# Patient Record
Sex: Male | Born: 1945 | Race: Black or African American | Hispanic: No | Marital: Married | Smoking: Former smoker
Health system: Southern US, Community
[De-identification: ages and names within clinical notes are randomized; demographics above are authoritative.]

---

## 2000-08-11 ENCOUNTER — Encounter: Admission: RE | Admit: 2000-08-11 | Discharge: 2000-08-11 | Payer: Self-pay | Admitting: Family Medicine

## 2000-08-11 ENCOUNTER — Encounter: Payer: Self-pay | Admitting: Family Medicine

## 2006-04-28 ENCOUNTER — Ambulatory Visit: Payer: Self-pay | Admitting: Internal Medicine

## 2006-05-25 ENCOUNTER — Ambulatory Visit: Payer: Self-pay | Admitting: Internal Medicine

## 2007-06-21 ENCOUNTER — Ambulatory Visit: Payer: Self-pay | Admitting: Hematology & Oncology

## 2007-06-21 ENCOUNTER — Inpatient Hospital Stay (HOSPITAL_COMMUNITY): Admission: AD | Admit: 2007-06-21 | Discharge: 2007-06-25 | Payer: Self-pay | Admitting: Gastroenterology

## 2007-06-23 ENCOUNTER — Encounter (INDEPENDENT_AMBULATORY_CARE_PROVIDER_SITE_OTHER): Payer: Self-pay | Admitting: Gastroenterology

## 2007-07-03 LAB — CBC & DIFF AND RETIC
BASO%: 2 % (ref 0.0–2.0)
EOS%: 6.4 % (ref 0.0–7.0)
LYMPH%: 32.4 % (ref 14.0–48.0)
MCH: 22.5 pg — ABNORMAL LOW (ref 28.0–33.4)
MCHC: 31.3 g/dL — ABNORMAL LOW (ref 32.0–35.9)
MCV: 72.1 fL — ABNORMAL LOW (ref 81.6–98.0)
MONO%: 11.8 % (ref 0.0–13.0)
Platelets: 357 10*3/uL (ref 145–400)
RBC: 4.98 10*6/uL (ref 4.20–5.71)
Retic %: 1.4 % (ref 0.7–2.3)
WBC: 3.5 10*3/uL — ABNORMAL LOW (ref 4.0–10.0)

## 2007-07-03 LAB — MORPHOLOGY

## 2007-07-04 ENCOUNTER — Encounter: Admission: RE | Admit: 2007-07-04 | Discharge: 2007-07-04 | Payer: Self-pay | Admitting: Gastroenterology

## 2007-07-05 LAB — TRANSFERRIN RECEPTOR, SOLUABLE: Transferrin Receptor, Soluble: 135.7 nmol/L

## 2007-08-03 ENCOUNTER — Ambulatory Visit (HOSPITAL_COMMUNITY): Admission: RE | Admit: 2007-08-03 | Discharge: 2007-08-03 | Payer: Self-pay | Admitting: Gastroenterology

## 2007-08-22 ENCOUNTER — Ambulatory Visit: Payer: Self-pay | Admitting: Hematology & Oncology

## 2007-08-24 LAB — CBC WITH DIFFERENTIAL (CANCER CENTER ONLY)
BASO%: 0.3 % (ref 0.0–2.0)
Eosinophils Absolute: 0.2 10*3/uL (ref 0.0–0.5)
LYMPH%: 32.2 % (ref 14.0–48.0)
MCH: 24.7 pg — ABNORMAL LOW (ref 28.0–33.4)
MCV: 77 fL — ABNORMAL LOW (ref 82–98)
MONO%: 11.8 % (ref 0.0–13.0)
NEUT#: 1.8 10*3/uL (ref 1.5–6.5)
Platelets: 292 10*3/uL (ref 145–400)
RBC: 5.16 10*6/uL (ref 4.20–5.70)
RDW: 16.6 % — ABNORMAL HIGH (ref 10.5–14.6)
WBC: 3.5 10*3/uL — ABNORMAL LOW (ref 4.0–10.0)

## 2007-08-24 LAB — FERRITIN: Ferritin: 61 ng/mL (ref 22–322)

## 2007-08-24 LAB — RETICULOCYTES (CHCC): Retic Ct Pct: 0.7 % (ref 0.4–3.1)

## 2007-11-08 ENCOUNTER — Ambulatory Visit: Payer: Self-pay | Admitting: Hematology & Oncology

## 2007-11-09 LAB — CHCC SATELLITE - SMEAR

## 2007-11-09 LAB — MANUAL DIFFERENTIAL (CHCC SATELLITE)
ANC (CHCC HP manual diff): 0.9 10*3/uL — ABNORMAL LOW (ref 1.5–6.5)
LYMPH: 44 % (ref 14–48)
PLT EST ~~LOC~~: ADEQUATE
SEG: 29 % — ABNORMAL LOW (ref 40–75)

## 2007-11-09 LAB — CBC WITH DIFFERENTIAL (CANCER CENTER ONLY)
MCHC: 32 g/dL (ref 32.0–35.9)
Platelets: 190 10*3/uL (ref 145–400)
RDW: 15.7 % — ABNORMAL HIGH (ref 10.5–14.6)
WBC: 3.2 10*3/uL — ABNORMAL LOW (ref 4.0–10.0)

## 2007-11-09 LAB — RETICULOCYTES (CHCC)
RBC.: 5.69 MIL/uL (ref 4.22–5.81)
Retic Ct Pct: 0.5 % (ref 0.4–3.1)

## 2008-03-08 ENCOUNTER — Ambulatory Visit: Payer: Self-pay | Admitting: Hematology & Oncology

## 2008-03-11 LAB — CBC WITH DIFFERENTIAL (CANCER CENTER ONLY)
BASO%: 0.3 % (ref 0.0–2.0)
EOS%: 13.1 % — ABNORMAL HIGH (ref 0.0–7.0)
HCT: 37.9 % — ABNORMAL LOW (ref 38.7–49.9)
LYMPH%: 44.8 % (ref 14.0–48.0)
MCH: 23.9 pg — ABNORMAL LOW (ref 28.0–33.4)
MCHC: 32.4 g/dL (ref 32.0–35.9)
MCV: 74 fL — ABNORMAL LOW (ref 82–98)
MONO#: 0.3 10*3/uL (ref 0.1–0.9)
MONO%: 12.6 % (ref 0.0–13.0)
NEUT%: 29.2 % — ABNORMAL LOW (ref 40.0–80.0)
Platelets: 203 10*3/uL (ref 145–400)
RDW: 16.2 % — ABNORMAL HIGH (ref 10.5–14.6)
WBC: 2.5 10*3/uL — ABNORMAL LOW (ref 4.0–10.0)

## 2008-03-11 LAB — CHCC SATELLITE - SMEAR

## 2008-03-13 LAB — FERRITIN: Ferritin: 25 ng/mL (ref 22–322)

## 2008-03-13 LAB — RETICULOCYTES (CHCC)
ABS Retic: 45 10*3/uL (ref 19.0–186.0)
RBC.: 5.62 MIL/uL (ref 4.22–5.81)

## 2008-07-05 ENCOUNTER — Ambulatory Visit: Payer: Self-pay | Admitting: Hematology & Oncology

## 2008-07-08 LAB — CHCC SATELLITE - SMEAR

## 2008-07-08 LAB — CBC WITH DIFFERENTIAL (CANCER CENTER ONLY)
Eosinophils Absolute: 0.3 10*3/uL (ref 0.0–0.5)
HCT: 41.9 % (ref 38.7–49.9)
LYMPH%: 41.1 % (ref 14.0–48.0)
MCH: 23.5 pg — ABNORMAL LOW (ref 28.0–33.4)
MCV: 74 fL — ABNORMAL LOW (ref 82–98)
MONO%: 13.6 % — ABNORMAL HIGH (ref 0.0–13.0)
NEUT%: 36.1 % — ABNORMAL LOW (ref 40.0–80.0)
Platelets: 200 10*3/uL (ref 145–400)
RBC: 5.64 10*6/uL (ref 4.20–5.70)

## 2008-07-08 LAB — RETICULOCYTES (CHCC)
ABS Retic: 44.9 10*3/uL (ref 19.0–186.0)
RBC.: 5.61 MIL/uL (ref 4.22–5.81)
Retic Ct Pct: 0.8 % (ref 0.4–3.1)

## 2008-07-08 LAB — TECHNOLOGIST REVIEW CHCC SATELLITE

## 2008-10-11 ENCOUNTER — Ambulatory Visit: Payer: Self-pay | Admitting: Hematology & Oncology

## 2008-10-14 LAB — CBC WITH DIFFERENTIAL (CANCER CENTER ONLY)
BASO#: 0 10*3/uL (ref 0.0–0.2)
Eosinophils Absolute: 0.2 10*3/uL (ref 0.0–0.5)
HGB: 13.1 g/dL (ref 13.0–17.1)
LYMPH%: 45.8 % (ref 14.0–48.0)
MCH: 23.8 pg — ABNORMAL LOW (ref 28.0–33.4)
MCV: 74 fL — ABNORMAL LOW (ref 82–98)
MONO#: 0.4 10*3/uL (ref 0.1–0.9)
MONO%: 12.3 % (ref 0.0–13.0)
RBC: 5.52 10*6/uL (ref 4.20–5.70)
WBC: 3.1 10*3/uL — ABNORMAL LOW (ref 4.0–10.0)

## 2008-10-14 LAB — FERRITIN: Ferritin: 11 ng/mL — ABNORMAL LOW (ref 22–322)

## 2008-11-11 ENCOUNTER — Ambulatory Visit: Payer: Self-pay | Admitting: Hematology & Oncology

## 2009-01-03 ENCOUNTER — Ambulatory Visit: Payer: Self-pay | Admitting: Hematology & Oncology

## 2009-01-06 LAB — CBC WITH DIFFERENTIAL (CANCER CENTER ONLY)
BASO#: 0 10*3/uL (ref 0.0–0.2)
Eosinophils Absolute: 0.2 10*3/uL (ref 0.0–0.5)
HGB: 13.3 g/dL (ref 13.0–17.1)
LYMPH#: 1.4 10*3/uL (ref 0.9–3.3)
MONO#: 0.3 10*3/uL (ref 0.1–0.9)
NEUT#: 1.3 10*3/uL — ABNORMAL LOW (ref 1.5–6.5)
RBC: 5.52 10*6/uL (ref 4.20–5.70)
WBC: 3.2 10*3/uL — ABNORMAL LOW (ref 4.0–10.0)

## 2009-01-06 LAB — CHCC SATELLITE - SMEAR

## 2009-01-06 LAB — RETICULOCYTES (CHCC): Retic Ct Pct: 0.5 % (ref 0.4–3.1)

## 2009-07-11 ENCOUNTER — Ambulatory Visit: Payer: Self-pay | Admitting: Hematology & Oncology

## 2009-08-21 ENCOUNTER — Ambulatory Visit: Payer: Self-pay | Admitting: Hematology & Oncology

## 2009-08-25 LAB — CBC WITH DIFFERENTIAL (CANCER CENTER ONLY)
BASO#: 0 10*3/uL (ref 0.0–0.2)
BASO%: 0.3 % (ref 0.0–2.0)
EOS%: 6.3 % (ref 0.0–7.0)
Eosinophils Absolute: 0.2 10*3/uL (ref 0.0–0.5)
HCT: 38.6 % — ABNORMAL LOW (ref 38.7–49.9)
HGB: 12.6 g/dL — ABNORMAL LOW (ref 13.0–17.1)
LYMPH#: 1.5 10*3/uL (ref 0.9–3.3)
LYMPH%: 44.7 % (ref 14.0–48.0)
MCH: 23.9 pg — ABNORMAL LOW (ref 28.0–33.4)
MCHC: 32.6 g/dL (ref 32.0–35.9)
MCV: 73 fL — ABNORMAL LOW (ref 82–98)
MONO#: 0.3 10*3/uL (ref 0.1–0.9)
MONO%: 8.4 % (ref 0.0–13.0)
NEUT#: 1.3 10*3/uL — ABNORMAL LOW (ref 1.5–6.5)
NEUT%: 40.3 % (ref 40.0–80.0)
Platelets: 199 10*3/uL (ref 145–400)
RBC: 5.28 10*6/uL (ref 4.20–5.70)
RDW: 16.3 % — ABNORMAL HIGH (ref 10.5–14.6)
WBC: 3.3 10*3/uL — ABNORMAL LOW (ref 4.0–10.0)

## 2009-08-25 LAB — TECHNOLOGIST REVIEW CHCC SATELLITE

## 2009-08-26 LAB — FERRITIN: Ferritin: 12 ng/mL — ABNORMAL LOW (ref 22–322)

## 2009-08-26 LAB — RETICULOCYTES (CHCC)
ABS Retic: 58.3 10*3/uL (ref 19.0–186.0)
RBC.: 5.3 MIL/uL (ref 4.22–5.81)
Retic Ct Pct: 1.1 % (ref 0.4–3.1)

## 2010-01-09 ENCOUNTER — Ambulatory Visit: Payer: Self-pay | Admitting: Hematology & Oncology

## 2010-05-25 IMAGING — CT CT ENTERO PELVIS W/CM
2 of 6 series · 16 of 46 positions shown, 18 images · IV contrast (VOLUMEN & [ID] OMNI 300)
Comparison: None

CT ABDOMEN:

CLINICAL DATA: Anemia.  patient without complaints.

CT ABDOMEN AND PELVIS WITH CONTRAST (CT ENTEROGRAPHY)
TECHNIQUE: Multidetector CT of the abdomen and pelvis during bolus
administration of intravenous contrast. Negative oral contrast
VoLumen was given.
Contrast:  100 ml [id number]

[Series 3: enterography · axial · 0.74mm/px · z∈[-399,+1]mm · 13 of 182 slices shown, 15 images]
[im 11/182  soft-tissue]
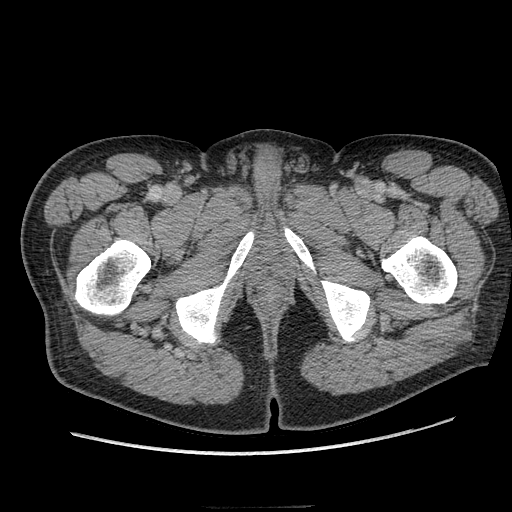
[im 11/182  bone]
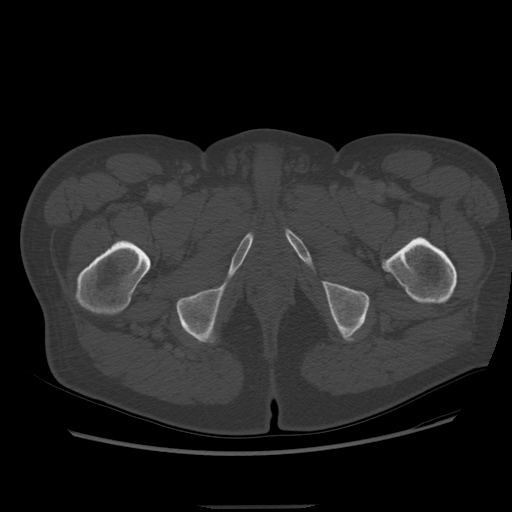
[im 22/182  soft-tissue]
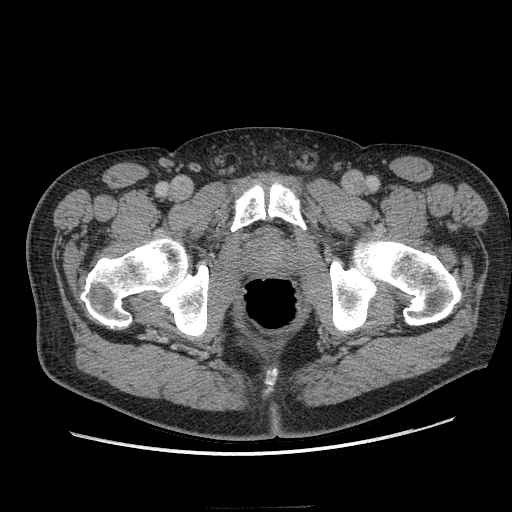
[im 43/182  soft-tissue]
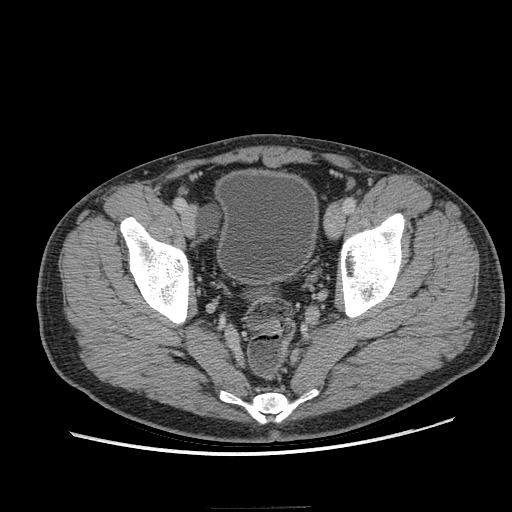
[im 54/182  soft-tissue]
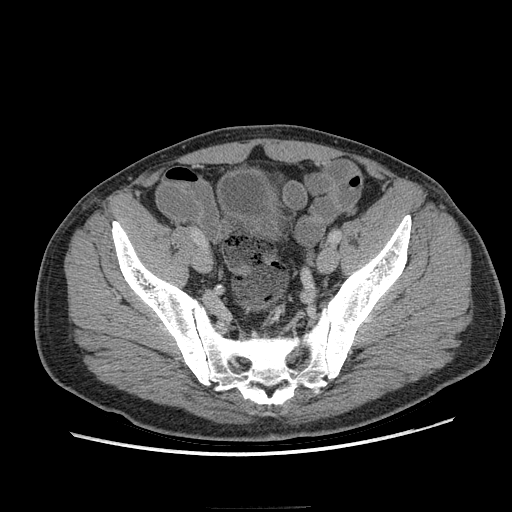
[im 64/182  soft-tissue]
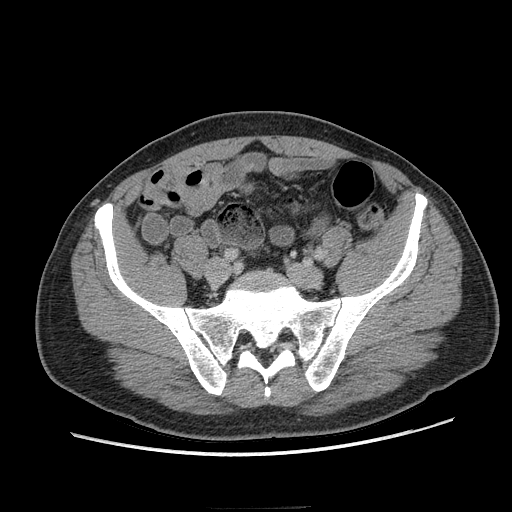
[im 75/182  soft-tissue]
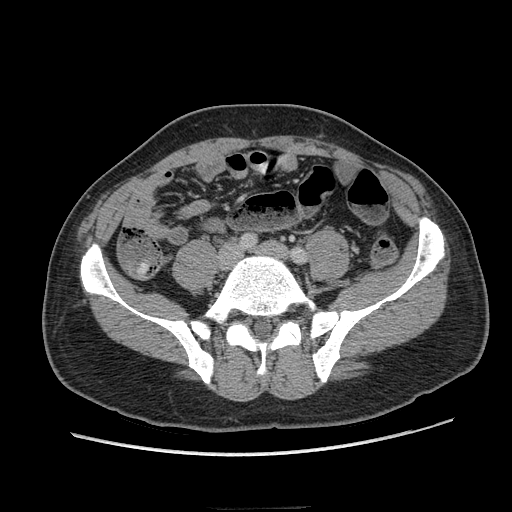
[im 96/182  soft-tissue]
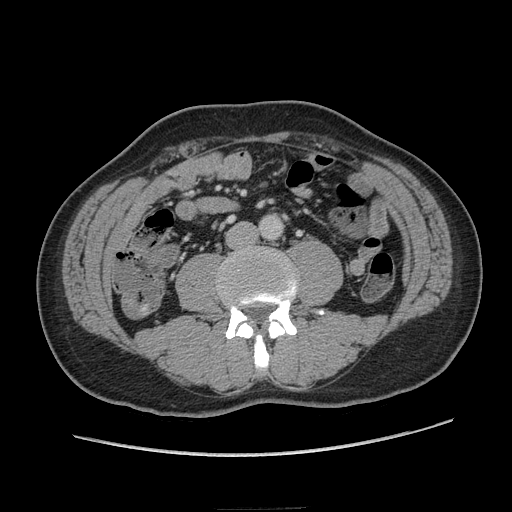
[im 107/182  soft-tissue]
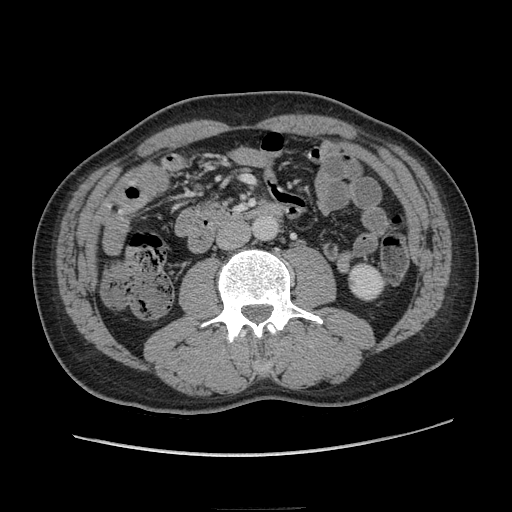
[im 118/182  soft-tissue]
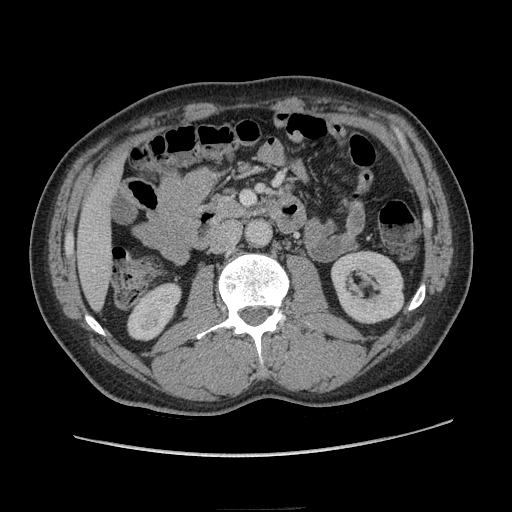
[im 118/182  bone]
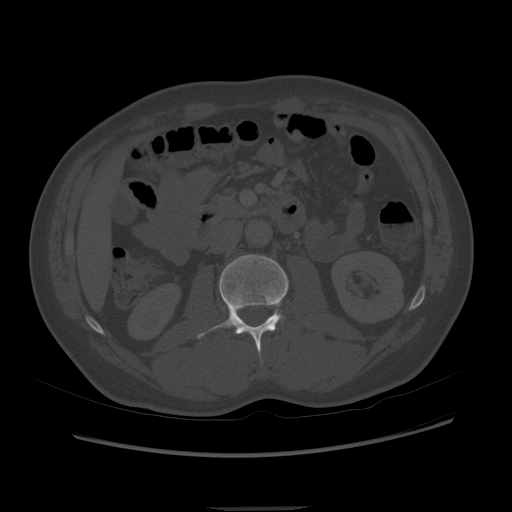
[im 128/182  soft-tissue]
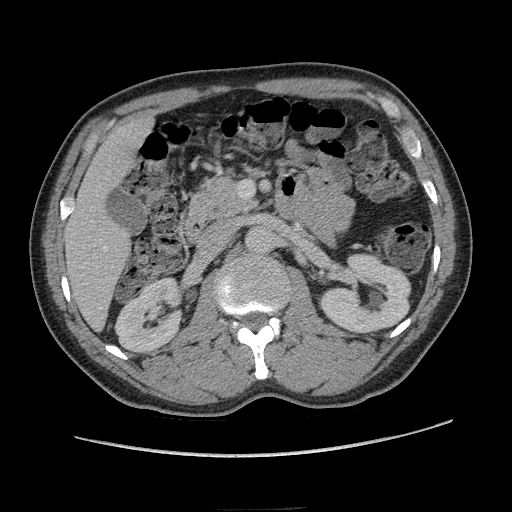
[im 139/182  soft-tissue]
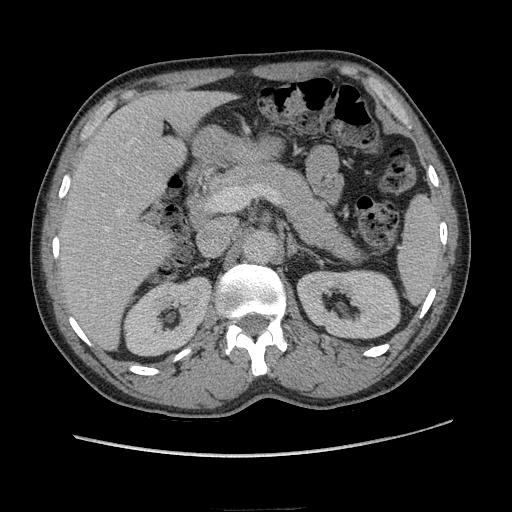
[im 160/182  soft-tissue]
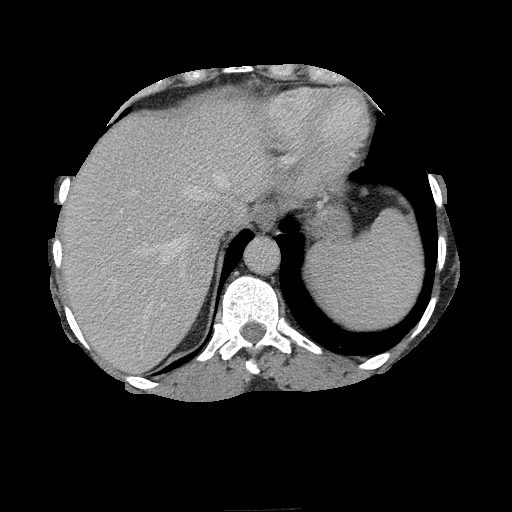
[im 171/182  soft-tissue]
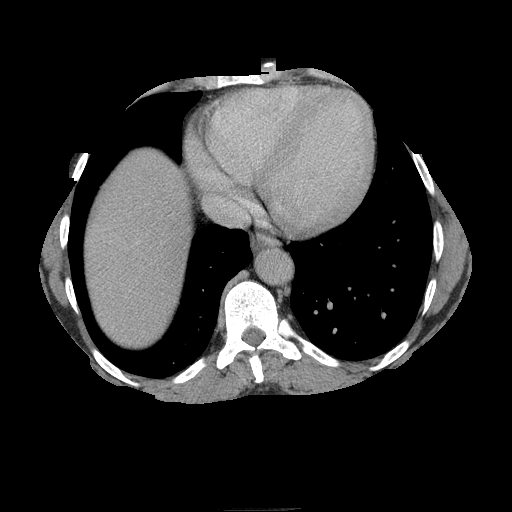

[Series 602: sagittal body · sagittal · 1.03mm/px · 3 of 149 slices shown]
[im 50/149  soft-tissue]
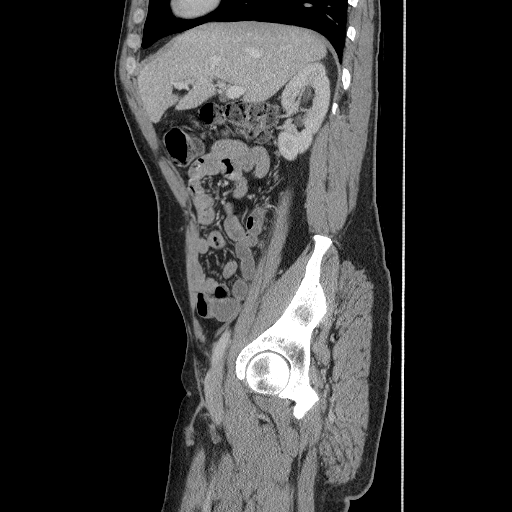
[im 66/149  soft-tissue]
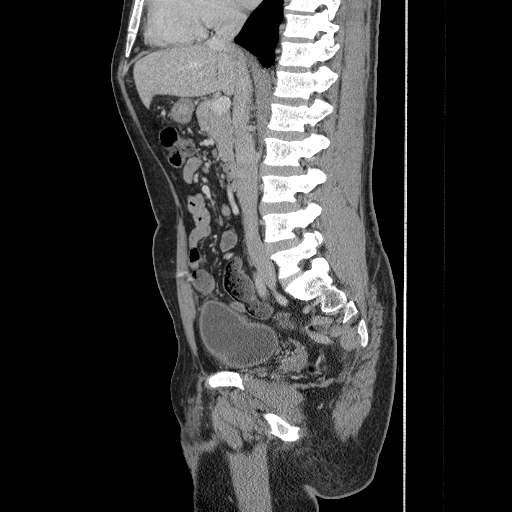
[im 83/149  soft-tissue]
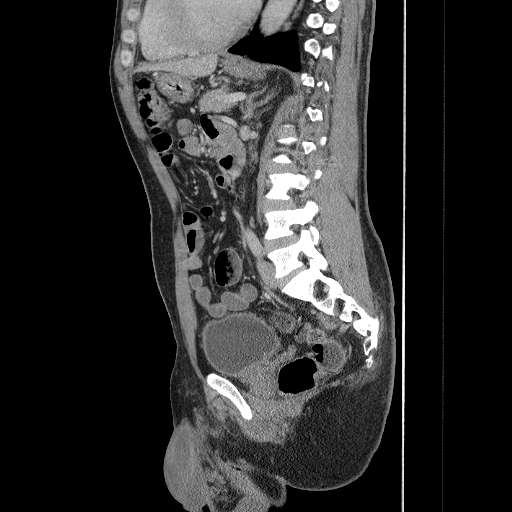

[16 of 46 positions shown; findings below may reference images not displayed]

FINDINGS: Clear lung bases.  Mild cardiomegaly without pericardial
or pleural effusion.  Normal liver, spleen.  The stomach is under
distended.  Normal pancreas, gallbladder, biliary tract .  A left
renal lesion measures 1.0 cm and fluid density on portal venous
phase images.  Other renal lesions are too small to characterize.
Punctate nonobstructive left renal calculus suspected on image 74
coronals.  No ascites.  No retroperitoneal or retrocrural
adenopathy. Normal colon.

The extent of small bowel distension with enteric contrast is poor
to moderate.  The terminal ileum is normal on image 88 of series 3.
Given this limitation, no abnormality within the small bowel
identified.  No wall thickening, small bowel mass, or mesenteric
abnormality.  Loops of small bowel in the right upper abdomen
appear thick-walled, felt to be due to underdistension.  Image 68
of series 3.The surrounding mesenteric fat is within normal limits.
IMPRESSION: 1.  Poor to moderate small bowel distension with enteric contrast.
This limits evaluation.  No convincing evidence of acute process to
explain anemia.  Apparent small bowel wall thickening in the right
abdomen is felt to be due to underdistension.  If no other cause
for anemia is discovered, further evaluation should be considered.
This could be performed with standard postcontrast CT.
2.  Otherwise no acute abdominal process.
3.  Punctate left renal calculus.

CT PELVIS:
FINDINGS: Normal pelvic bowel loops.  A well-circumscribed 2.8 cm
fluid density structure immediately medial to the right external
iliac vasculature on image 72 is felt to most likely represent a
iliopsoas bursal collection.  No pelvic adenopathy.  Normal urinary
bladder and prostate.  Tiny sclerotic lesions in the bony pelvis
are likely bone islands.
IMPRESSION: 1.  No acute pelvic process.
2.  Probable iliopsoas bursal fluid collection adjacent to the
right groin vasculature, as described.

## 2010-06-16 NOTE — H&P (Signed)
[name], [name] NO.:  [id number]   MEDICAL RECORD NO.:  [id number]          PATIENT TYPE:  INP   LOCATION:  5158                         FACILITY:  [facility]   PHYSICIAN:  Graylin Shiver, M.D.   DATE OF BIRTH:  1945-05-25   DATE OF ADMISSION:  06/21/2007  DATE OF DISCHARGE:                              HISTORY & PHYSICAL   CHIEF COMPLAINT:  Anemia.   HISTORY OF PRESENT ILLNESS:  This patient is a 65 year old male who was  referred over to the office by Dr. Clovis Riley, because of severe anemia.  The patient essentially has been feeling okay according to him, but he  has been getting progressively tired very easily over the past 2-3  month.  He has also been noticing swelling at his lower extremities.  The patient was seen by Dr. Clovis Riley and on Jun 19, 2007, he had some  blood work drawn.  His hemoglobin was 3.9 and hematocrit 13.  The  patient denies any visible signs of gastrointestinal bleeding.  He has  no complaints of abdominal pain.  Denies dysphagia, odynophagia, nausea,  vomiting, hematemesis, or melena.  He denies chest pain.  He does have  shortness of breath with exertion.  He has lost approximately 10-12  pounds in the past few months, although he states that his appetite has  been okay.   ALLERGIES:  None known.   MEDICATIONS:  1. Hydrochlorothiazide.  2. Simvastatin 80 mg nightly.  3. Tiazac.  4. Multivitamin.  5. Garlic.  6. Cod liver oil.   MEDICAL PROBLEMS:  Hypertension and hypercholesterolemia.   SURGERIES:  None.   SOCIAL HISTORY:  He does not smoke or drink alcohol.   FAMILY HISTORY:  Noncontributory.   REVIEW OF SYSTEMS:  Negative, except for above.   PHYSICAL EXAMINATION:  Weight 198, blood pressure 110/50, pulse 72, and  temperature is 97.2.  He is in no distress.  Nonicteric.  Mouth and throat clear.  NECK:  Supple.  HEART:  Regular rhythm.  No murmurs, gallops, or rubs.  LUNGS:  Clear.  ABDOMEN:  Bowel sounds normal.   Soft and nontender.  No  hepatosplenomegaly.  EXTREMITIES:  Show 1+ pitting edema in the pretibial area.   IMPRESSION:  1. Severe anemia.  2. Weight loss.   PLAN:  Even though, the patient feels okay, his anemia is very critical  in my opinion and I think that we need to get him a blood transfusion  ASAP.  I think his workup and treatment will be expedited by admission  to the hospital.  I have therefore admitted him to [facility].  We will type and cross him, transfuse blood, and try to get his  hemoglobin up to an acceptable level.  I think he is going to need an  EGD and colonoscopy to further evaluate the upper and lower GI tract to  look for a source of this significant anemia.   I do not feel comfortable proceeding with evaluation or treatment at  this time as an outpatient.  ______________________________  Graylin Shiver, M.D.     SFG/MEDQ  D:  06/21/2007  T:  06/22/2007  Job:  [id number]   cc:   Joni Fears D. Young, MD, FCCP, FACP  L. Lupe Carney, M.D.

## 2010-06-16 NOTE — Consult Note (Signed)
NAMEGIORGI, [name] NO.:  [id number]   MEDICAL RECORD NO.:  [id number]          PATIENT TYPE:  INP   LOCATION:  5158                         FACILITY:  [facility]   PHYSICIAN:  Rose Phi. Myna Hidalgo, M.D. DATE OF BIRTH:  [birth year]   DATE OF CONSULTATION:  06/23/2007  DATE OF DISCHARGE:                                 CONSULTATION   REFERRING PHYSICIAN:  Graylin Shiver, MD.   REASON FOR CONSULTATION:  Severe microcytic anemia - iron deficiency.   HISTORY OF PRESENT ILLNESS:  Mr. Rogerson is a very nice 65 year old black  gentleman who is followed by Dr. Clovis Riley.  He is a Naval architect.  He  apparently has been having more problems with fatigue and weakness.   He is on several medications.  He does have a history of high blood  pressure and hypertension.   He does state that he has had occasional black stool in the past.   He has been getting more and more tired.  He subsequently was found to  be quite anemic.  As such, he was admitted.   Lab work done back on Jun 19, 2007, showed a white cell count of 3.1,  hemoglobin 3.9, hematocrit 13, and platelet count 90,000.  His MCV was  57.  He had normal BUN and creatinine.  His white cell differential  showed 46 segs, 26 lymphs, 16 monos, and 8 eosinophils.   He has been taking daily aspirin.  He denies any type of nonsteroidal  use.  He does not smoke or drink.   He was admitted.  He had numerous labs on admission.  He had B12 level  of 426.  Folate was greater than 20.  Iron studies showed iron less than  10 and TIBC of 462.   He had normal metabolic panel.  He had a BUN of 7 and creatinine 0.96.  Total protein 6.7 and albumin 3.6.   He had an LDH which was unremarkable.  He did undergo upper and lower  endoscopy.  This was done by Dr. Charlott Rakes.  There was no obvious  source of bleeding.  Biopsies, I think were taken.   Because of the negative colonoscopy and upper endoscopy, it was felt  that he needed to be  seen by hematology.   His appetite has been okay.  He is not a vegetarian.   He has been chewing a whole lot of ice.  He has been doing this for  several months.  He also says that he has been drinking vinegar.   On admission, his white cell count was 3.5, hemoglobin was 4.2, and  platelet count was 113.   On the 20th, white cell count was 4.4 and platelet count was 213,000.   On Jun 23, 2007, white cell count was 4.8, hemoglobin 7.8, hematocrit  25, and platelet count 154.  MCV was 69.   He has had 96 units of blood from what he tells me.   PAST MEDICAL HISTORY:  1. Hypertension.  2. Hyperlipidemia.   ALLERGIES:  None.   CURRENT MEDICATIONS:  1. Zocor 40 mg daily.  2. Hydrochlorothiazide 25 mg daily.  3. Cardizem CD 360 mg p.o. daily.   SOCIAL HISTORY:  Negative for tobacco and alcohol use.  He is a Ecologist.   FAMILY HISTORY:  Noncontributory.   REVIEW OF SYSTEMS:  As stated in the history of present illness.  He has  a little bit of headache.  He denies any double vision or blurred  vision.  There is no swallowing difficulties.  He has no nausea or  vomiting.  There has been no diarrhea or constipation.  Again, he has  noted occasional black stool.  He has had no cough.  He does get short  of breath with exertion.  He has had some swelling in his legs.   PHYSICAL EXAMINATION:  This is a well-developed, well-nourished black  gentleman in no obvious distress.  VITAL SIGNS:  Temperature 97.9, pulse 61, respiratory rate 18, and blood  pressure 123/75.  HEAD AND NECK:  Shows pale conjunctivae.  His pupils are reacting  appropriately.  He has no cheilitis.  There are no oral lesions.  Tongue  is without glossitis.  NECK:  Supple.  No adenopathy.  Thyroid nonpalpable.  LUNGS:  Clear bilaterally.  CARDIAC:  Regular rate and rhythm with normal S1 and S2.  He has a 1/6  systolic ejection murmur.  AXILLARY:  Shows no bilateral axillary adenopathy.  ABDOMEN:  Soft and  good bowel sounds.  He has no palpable abdominal  mass.  No palpable hepatosplenomegaly.  EXTREMITIES:  Shows no clubbing, cyanosis, or edema.  NEUROLOGIC:  No focal neurological deficits.  SKIN:  No rashes, ecchymoses, or petechiae.   Peripheral smear is pending.   IMPRESSION:  Mr. Doyon is a 65 year old gentleman with severe microcytic  anemia.  This is all consistent with iron deficiency.  Again, I do not  see any problems with respect to his white cells and platelets.  It is  certainly not uncommon for African Americans to have a slightly  depressed white cell count.   Once I review his peripheral smear, this will give me little more  information.   I do feel that he does need intravenous iron.  Again, iron dextran is  not available.  With iron dextran, we can give all the iron back in one  dose.  We will have to give him intravenous Venofer.  This could be  given once a week.  I suspect that he is going to need at least 4 weeks  of intravenous Venofer.   I do think a capsule endoscopy certainly would be indicated.  We will  leave this to GI for evaluation.   I do not see a need for bone marrow test right now.  Again, his white  cells and platelets all look fine.  Again, I will certainly review his  blood smear.   I do not see any evidence of sideroblastic anemia.  He has no evidence  for sickle cell anemia or other hemoglobinopathy.  He certainly is not  hemolyzing.   I do not see any other evidence of any nutritional deficiency.  Sometimes copper deficiency can lead to low white cells with microcytic  anemia.   I had an excellent fellowship with Mr. Gift and his family.  We are all  strong Christians, and we certainly share our faith, which certainly  made me feel better.   We will plan for the intravenous iron for tomorrow.  We can do the  rest  as an outpatient if he is going to be discharged to home.   Thank you so much for allowing me to see Mr. Coster.  We will  follow up as  an outpatient.      Rose Phi. Myna Hidalgo, M.D.  Electronically Signed     PRE/MEDQ  D:  06/23/2007  T:  06/24/2007  Job:  [id number]   cc:   Graylin Shiver, M.D.  Elsworth Soho, M.D.

## 2010-06-16 NOTE — Op Note (Signed)
NAMEKELECHI, [name] NO.:  [id number]   MEDICAL RECORD NO.:  [id number]          PATIENT TYPE:  AMB   LOCATION:  ENDO                         FACILITY:  [facility]   PHYSICIAN:  Graylin Shiver, M.D.   DATE OF BIRTH:  1945/06/04   DATE OF PROCEDURE:  08/03/2007  DATE OF DISCHARGE:  08/03/2007                               OPERATIVE REPORT   PROCEDURE:  Capsule endoscopy.   INDICATIONS FOR PROCEDURE:  Unexplained iron deficiency anemia.  The  patient had an EGD, colonoscopy and CT enterography, all of which were  unrevealing for a source of iron deficiency anemia from the GI tract.   The patient swallowed the capsule in the usual fashion and images were  obtained.  Review of the capsule endoscopy report reveals a normal  examination.  No lesions seen.  No evidence of active bleeding.  He had  no problems reported with the procedure.   IMPRESSION:  Normal capsule endoscopy.  No lesions seen to explain iron-  deficiency anemia.           ______________________________  Graylin Shiver, M.D.     SFG/MEDQ  D:  08/16/2007  T:  08/16/2007  Job:  [id number]   cc:   L. Lupe Carney, M.D.  Fax: [phone]   Rose Phi. Myna Hidalgo, M.D.  Fax: [phone]

## 2010-06-16 NOTE — Discharge Summary (Signed)
NAMEJORDAN, PARDINI NO.:  [id number]   MEDICAL RECORD NO.:  [id number]          PATIENT TYPE:  INP   LOCATION:  5158                         FACILITY:  [facility]   PHYSICIAN:  Bernette Redbird, M.D.   DATE OF BIRTH:  1945-09-14   DATE OF ADMISSION:  06/21/2007  DATE OF DISCHARGE:  06/25/2007                               DISCHARGE SUMMARY   FINAL DIAGNOSES:  1. 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  2. History of mild weight loss.  3. History of hypertension.  4. History of hypercholesterolemia.   COMPLICATIONS:  None.   CONSULTATIONS:  Rose Phi. Myna Hidalgo, M.D., Hematology.   PROCEDURES:  1. Upper endoscopy with biopsy.  2. Colonoscopy.  3. Iron infusion.  4. Transfusion of 7 units of packed red cells.   LABORATORY DATA:  Admission hemoglobin was 4.2 with an MCV of 57 and RDW  of 33.  Discharge hemoglobin was 9.9.  Admission white count 4500,  differential not performed.  Discharge white count 6800.  Admission  platelets 113,000 and discharge platelets 350,000.  Chemistry panel on  admission was normal except for potassium of 3.4.  Liver chemistries  were normal.  Albumin was 4.0.  Prior to transfusion, serum iron was  less than 10 and ferritin was 4 (that was actually obtained following  transfusion).  B12 and folate were normal.   Urinalysis was clear, and urine culture was negative.   HOSPITAL COURSE:  Mr. Okuda was sent over from Dr. August Saucer Mitchell's  office, because of anemia and Dr. Evette Cristal felt that his evaluation and  management could most appropriately be done as an inpatient.  He was  therefore admitted to the floor bed, transfused a total of 7 units of  packed cells, and then underwent endoscopy and colonoscopy by Dr.  Bosie Clos.  There was some nodularity in the proximal duodenum, which was  biopsied.  The biopsy results are pending at this time.  The colonoscopy  was grossly normal to the terminal ileum with a fair prep.  No source  of  anemia was evident on either examination.   With those studies thus being negative, and with the patient having been  hemoccult negative as an outpatient prior to admission, the patient was  seen by Dr. Arlan Organ who initiated an iron infusion, which was well  tolerated (Venofer).   By this time, the patient was feeling much stronger and his hemoglobin  had been stable post transfusion for several days, so discharge home was  felt to be appropriate.   DISPOSITION:  The patient is discharged to home on an unrestricted diet.  It is anticipated he will not return to work until his further GI  evaluation is completed and perhaps further iron infusions are  administered.  Activity is as tolerated.   DISCHARGE MEDICATIONS:  The patient will resume his previous home  medications which include:  1. Hydrochlorothiazide 25 mg daily.  2. Simvastatin 80 mg daily.  3. Diltzac 1 q.p.m.  4. Cod liver oil.  5. Garlic.  6. Multiple vitamins.   Tests pending at  the time of this discharge:  Duodenal biopsy results.   Planned evaluation and management following discharge:  Small bowel  evaluation (? small bowel capsule endoscopy versus radiographic  evaluation of the small bowel) through Dr. Evette Cristal, and further iron  infusions through Dr. Myna Hidalgo.   The patient is to call Dr. Luan Moore office to arrange followup  appointment in the near future, at which time his repeat CBC might be  checked and arrangements made for small bowel evaluation.  The patient  will contact Dr. Gustavo Lah office, if they do not call him first, to  arrange further iron infusions.   CONDITION ON DISCHARGE:  Improved.           ______________________________  Bernette Redbird, M.D.     RB/MEDQ  D:  06/25/2007  T:  06/25/2007  Job:  [id number]   cc:   Rose Phi. Myna Hidalgo, M.D.  Elsworth Soho, M.D.  Graylin Shiver, M.D.

## 2010-06-16 NOTE — Op Note (Signed)
NAMEJOHNN, KRASOWSKI NO.:  [id number]   MEDICAL RECORD NO.:  [id number]           PATIENT TYPE:   LOCATION:                                 FACILITY:   PHYSICIAN:  Shirley Friar, MDDATE OF BIRTH:  02/21/1945   DATE OF PROCEDURE:  DATE OF DISCHARGE:                               OPERATIVE REPORT   INDICATIONS:  Anemia.   MEDICATIONS:  Fentanyl 100 mcg IV and Versed 7 mg IV.   FINDINGS:  Rectal exam was normal.  A pediatric Pentax colonoscope was  inserted through the fair prepped colon and advanced to the cecum where  ileocecal valve and appendiceal orifice were identified.  In order to  reach the cecum, the patient had to be turned in the supine position and  abdominal pressure had to be applied.  Repeated loop reduction was also  necessary.  The terminal ileum was then intubated and was normal in  appearance.  On careful withdrawal of the colonoscope, no mucosal  abnormalities were seen.  Retroflexion was unremarkable.   ASSESSMENT:  Normal colonoscopy.   PLAN:  1. Repeat colonoscopy in 5 years.  2. Proceed with EGD.      Shirley Friar, MD  Electronically Signed     VCS/MEDQ  D:  06/23/2007  T:  06/24/2007  Job:  [id number]   cc:   Graylin Shiver, M.D.  Elsworth Soho, M.D.

## 2010-06-16 NOTE — Op Note (Signed)
[name], [name] NO.:  [id number]   MEDICAL RECORD NO.:  [id number]          PATIENT TYPE:  INP   LOCATION:  5158                         FACILITY:  [facility]   PHYSICIAN:  Shirley Friar, MDDATE OF BIRTH:  1945-12-17   DATE OF PROCEDURE:  DATE OF DISCHARGE:                               OPERATIVE REPORT   INDICATION:  Anemia.   MEDICATIONS:  Cetacaine spray x2, Versed 2 mg IV, additional medicine  given for preceding colonoscopy.   FINDINGS:  Endoscope was inserted through the oropharynx and esophagus  was intubated, which was normal in its entirety.  Endoscope was advanced  down to the stomach, which revealed normal stomach mucosa.  Retroflexion  was done, which revealed normal proximal stomach.  Endoscope was  straightened and advanced to the duodenal bulb where there was a  localized area of nodules with normal overlying mucosa.  Biopsies were  taken of these nodules.  The endoscope was advanced down to second  portion of duodenum, which was normal.   ASSESSMENT:  1. Duodenal bulb nodules, status post biopsy.  2. No source of anemia identified.   PLAN:  1. Follow up on path.  2. 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      Shirley Friar, MD  Electronically Signed     VCS/MEDQ  D:  06/23/2007  T:  06/23/2007  Job:  [id number]   cc:   Graylin Shiver, M.D.  Elsworth Soho, M.D.

## 2010-06-19 NOTE — Assessment & Plan Note (Signed)
[facility]                             PULMONARY OFFICE NOTE   LLEWELLYN, CHOPLIN                           MRN:          [id number]  DATE:04/28/2006                            DOB:          21-Aug-1945    PROBLEM:  A 65 year old gentleman followed by Dr. Lupe Carney for  Primary Care and self referred now to get his breathing checked.  Wife  is a patient here.   HISTORY:  He is a Naval architect, working mostly at night when he says  cold air through the truck window makes him short of breath, makes his  arms and legs feel weak, and his chest tight.  He has thought he was  more easily short of breath with exertion over the past year without any  sudden or specific event.  He has been never diagnosed with asthma, but  over the past 5 years has been more aware of seasonal rhinitis symptoms  in the spring and fall, and in the wintertime he seemed to have more  frequent colds with persistent phlegm.  He had a DOT physical in  [place] within the past couple of months with no specific findings  noted.   MEDICATIONS:  1. Diltiazem ER 380 mg.  2. Zocor 1/2 times 80mg .  3. HCTZ 25 mg.   NO DRUG ALLERGY LISTED.   REVIEW OF SYSTEMS:  Exertional dyspnea which is stable from one day to  the next and related mostly to brisk walks uphill.  Productive cough,  just sometimes hurts in very cold air only.  Nasal congestion.  His  weight has been stable.  He denies headache, fever, sweat, bloody or  purulent discharge.  He has coughed out tiny speck of blood once or  twice with very hard wintertime coughing.  He is concerned about areas  of hyperpigmented and thickened skin on his arms, trunk and legs which  seem to come and go over a period of several weeks.  Unaware of  adenopathy.  He denies exertional chest pain, palpitations, or syncope.   PAST HISTORY:  1. Hypertension.  2. Elevated cholesterol.  3. Allergic rhinitis.  4. No exposure to tuberculosis.  5. He has been told in the past he had a murmur.  6. No history of asthma or pneumonia.  7. He thinks he may have had pneumococcal vaccine remotely.  Did get      flu vaccine this fall.  8. No history of liver disease, cancer, or diabetes.   SOCIAL HISTORY:  He quit smoking 30 years ago, married with children.  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  He thinks he remembers working with benzene and  formaldehyde at that time.  No asbestos exposure.   FAMILY HISTORY:  Mother with allergies.   OBJECTIVE:  Weight 202 pounds, blood pressure 126/68, pulse regular 71,  room air saturation 100%.  He is a pleasant, relaxed gentleman  who seems  comfortable.  SKIN:  Several areas of 2-cm to 4-cm diameter, rather irregularly oval  hyperpigmentation and thickening on the trunk and legs, perhaps a total  of 10 or so in no particular pattern.  ADENOPATHY:  None found.  HEENT:  Dentures, nose and throat are clear.  No stridor or neck vein  distention, no thyromegaly.  CHEST:  I do not hear wheeze, cough, rales, or rhonchi.  Work of  breathing is not increased.  HEART:  Sounds are regular without murmur or gallop.  ABDOMEN:  No enlargement of liver or spleen.  EXTREMITIES:  No edema, cyanosis, or clubbing.   IMPRESSION:  1. Seasonal allergic rhinitis.  2. Dyspnea in cold air, suggest the possibility of asthma.  3. Skin lesion/rash, somewhat exematoid but not very specific.      Sarcoid might do this.   PLAN:  1. Blood for angiotensin converting enzyme, a CBC, and chemistry      panel.  2. Chest x-ray.  3. Pulmonary function test.  4. Consider possibility of an exercise stress test.  5. Skin biopsy.  6. Schedule return in 3 weeks, earlier p.r.n.     Clinton D. Maple Hudson, MD, Tonny Bollman, FACP  Electronically Signed    CDY/MedQ  DD: 04/29/2006  DT: 04/30/2006  Job #: [id number]    cc:   L. Lupe Carney, M.D.

## 2010-10-28 LAB — CROSSMATCH: Antibody Screen: NEGATIVE

## 2010-10-28 LAB — CBC
HCT: 13.9 — ABNORMAL LOW
HCT: 24.6 — ABNORMAL LOW
HCT: 24.8 — ABNORMAL LOW
HCT: 29.9 — ABNORMAL LOW
HCT: 33.8 — ABNORMAL LOW
Hemoglobin: 10.1 — ABNORMAL LOW
Hemoglobin: 11 — ABNORMAL LOW
Hemoglobin: 7.8 — CL
Hemoglobin: 8.4 — ABNORMAL LOW
Hemoglobin: 9.9 — ABNORMAL LOW
MCHC: 31.7
MCHC: 31.7
MCHC: 32.2
MCHC: 32.8
MCHC: 33.8
MCV: 69.6 — ABNORMAL LOW
Platelets: 113 — ABNORMAL LOW
Platelets: 182
Platelets: 213
Platelets: 280
RBC: 3.83 — ABNORMAL LOW
RDW: 33.5 — ABNORMAL HIGH
RDW: 34.6 — ABNORMAL HIGH
RDW: 34.9 — ABNORMAL HIGH
RDW: 36.7 — ABNORMAL HIGH
RDW: 36.8 — ABNORMAL HIGH
RDW: 37.3 — ABNORMAL HIGH
WBC: 3.5 — ABNORMAL LOW
WBC: 4.4
WBC: 4.8
WBC: 6.4

## 2010-10-28 LAB — BASIC METABOLIC PANEL
CO2: 28
Calcium: 9.1
Glucose, Bld: 96
Potassium: 3.8
Sodium: 141

## 2010-10-28 LAB — PROTEIN ELECTROPH W RFLX QUANT IMMUNOGLOBULINS
Albumin ELP: 57.1
Total Protein ELP: 6.5

## 2010-10-28 LAB — COMPREHENSIVE METABOLIC PANEL
ALT: 17
AST: 30
Albumin: 3.6
Albumin: 4
Alkaline Phosphatase: 59
BUN: 7
BUN: 9
Calcium: 9.3
Chloride: 101
Chloride: 105
Creatinine, Ser: 0.96
GFR calc Af Amer: >60
Potassium: 3.4 — ABNORMAL LOW
Sodium: 138
Total Bilirubin: 0.6
Total Bilirubin: 0.8
Total Protein: 7

## 2010-10-28 LAB — URINE CULTURE
Colony Count: NO GROWTH
Culture: NO GROWTH
Special Requests: NEGATIVE

## 2010-10-28 LAB — URINALYSIS, ROUTINE W REFLEX MICROSCOPIC
Bilirubin Urine: NEGATIVE
Glucose, UA: NEGATIVE
Hgb urine dipstick: NEGATIVE
Ketones, ur: NEGATIVE
Protein, ur: NEGATIVE

## 2010-10-28 LAB — FOLATE: Folate: >20

## 2010-10-28 LAB — VITAMIN B12: Vitamin B-12: 426 (ref 211–911)

## 2010-10-28 LAB — IRON AND TIBC

## 2010-10-28 LAB — ABO/RH: ABO/RH(D): O POS

## 2010-10-28 LAB — RETICULOCYTES: Retic Count, Absolute: 38.5

## 2010-10-28 LAB — SAVE SMEAR

## 2010-10-28 LAB — TISSUE TRANSGLUTAMINASE, IGA: Tissue Transglutaminase Ab, IgA: 0.2 U/mL (ref <7)

## 2015-04-16 DIAGNOSIS — L309 Dermatitis, unspecified: Secondary | ICD-10-CM | POA: Diagnosis not present

## 2015-06-16 DIAGNOSIS — E7800 Pure hypercholesterolemia, unspecified: Secondary | ICD-10-CM | POA: Diagnosis not present

## 2015-06-16 DIAGNOSIS — I10 Essential (primary) hypertension: Secondary | ICD-10-CM | POA: Diagnosis not present

## 2015-06-16 DIAGNOSIS — Z125 Encounter for screening for malignant neoplasm of prostate: Secondary | ICD-10-CM | POA: Diagnosis not present

## 2015-06-16 DIAGNOSIS — J309 Allergic rhinitis, unspecified: Secondary | ICD-10-CM | POA: Diagnosis not present

## 2015-11-24 DIAGNOSIS — H524 Presbyopia: Secondary | ICD-10-CM | POA: Diagnosis not present

## 2015-11-24 DIAGNOSIS — H2513 Age-related nuclear cataract, bilateral: Secondary | ICD-10-CM | POA: Diagnosis not present

## 2015-12-31 DIAGNOSIS — L290 Pruritus ani: Secondary | ICD-10-CM | POA: Diagnosis not present

## 2015-12-31 DIAGNOSIS — L309 Dermatitis, unspecified: Secondary | ICD-10-CM | POA: Diagnosis not present

## 2015-12-31 DIAGNOSIS — J309 Allergic rhinitis, unspecified: Secondary | ICD-10-CM | POA: Diagnosis not present

## 2015-12-31 DIAGNOSIS — E7800 Pure hypercholesterolemia, unspecified: Secondary | ICD-10-CM | POA: Diagnosis not present

## 2015-12-31 DIAGNOSIS — I10 Essential (primary) hypertension: Secondary | ICD-10-CM | POA: Diagnosis not present

## 2016-07-12 DIAGNOSIS — Z125 Encounter for screening for malignant neoplasm of prostate: Secondary | ICD-10-CM | POA: Diagnosis not present

## 2016-07-12 DIAGNOSIS — I10 Essential (primary) hypertension: Secondary | ICD-10-CM | POA: Diagnosis not present

## 2016-07-12 DIAGNOSIS — Z1159 Encounter for screening for other viral diseases: Secondary | ICD-10-CM | POA: Diagnosis not present

## 2016-07-12 DIAGNOSIS — L290 Pruritus ani: Secondary | ICD-10-CM | POA: Diagnosis not present

## 2016-07-12 DIAGNOSIS — E7800 Pure hypercholesterolemia, unspecified: Secondary | ICD-10-CM | POA: Diagnosis not present

## 2016-09-20 DIAGNOSIS — M549 Dorsalgia, unspecified: Secondary | ICD-10-CM | POA: Diagnosis not present

## 2016-10-04 DIAGNOSIS — I10 Essential (primary) hypertension: Secondary | ICD-10-CM | POA: Diagnosis not present

## 2016-10-04 DIAGNOSIS — R55 Syncope and collapse: Secondary | ICD-10-CM | POA: Diagnosis not present

## 2016-10-04 DIAGNOSIS — E785 Hyperlipidemia, unspecified: Secondary | ICD-10-CM | POA: Diagnosis not present

## 2016-10-04 DIAGNOSIS — D509 Iron deficiency anemia, unspecified: Secondary | ICD-10-CM | POA: Diagnosis not present

## 2016-10-04 DIAGNOSIS — R0789 Other chest pain: Secondary | ICD-10-CM | POA: Diagnosis not present

## 2016-10-04 DIAGNOSIS — D649 Anemia, unspecified: Secondary | ICD-10-CM | POA: Diagnosis not present

## 2016-10-04 DIAGNOSIS — R946 Abnormal results of thyroid function studies: Secondary | ICD-10-CM | POA: Diagnosis not present

## 2016-10-04 DIAGNOSIS — R42 Dizziness and giddiness: Secondary | ICD-10-CM | POA: Diagnosis not present

## 2016-10-05 DIAGNOSIS — R55 Syncope and collapse: Secondary | ICD-10-CM | POA: Diagnosis not present

## 2016-10-05 DIAGNOSIS — I351 Nonrheumatic aortic (valve) insufficiency: Secondary | ICD-10-CM | POA: Diagnosis not present

## 2016-10-05 DIAGNOSIS — I088 Other rheumatic multiple valve diseases: Secondary | ICD-10-CM | POA: Diagnosis not present

## 2016-10-05 DIAGNOSIS — I517 Cardiomegaly: Secondary | ICD-10-CM | POA: Diagnosis not present

## 2016-10-05 DIAGNOSIS — R946 Abnormal results of thyroid function studies: Secondary | ICD-10-CM | POA: Diagnosis not present

## 2016-10-05 DIAGNOSIS — D649 Anemia, unspecified: Secondary | ICD-10-CM | POA: Diagnosis not present

## 2016-10-05 DIAGNOSIS — I10 Essential (primary) hypertension: Secondary | ICD-10-CM | POA: Diagnosis not present

## 2016-10-05 DIAGNOSIS — I77810 Thoracic aortic ectasia: Secondary | ICD-10-CM | POA: Diagnosis not present

## 2016-10-05 DIAGNOSIS — R0789 Other chest pain: Secondary | ICD-10-CM | POA: Diagnosis not present

## 2016-10-06 DIAGNOSIS — R946 Abnormal results of thyroid function studies: Secondary | ICD-10-CM | POA: Diagnosis not present

## 2016-10-06 DIAGNOSIS — I10 Essential (primary) hypertension: Secondary | ICD-10-CM | POA: Diagnosis not present

## 2016-10-06 DIAGNOSIS — R55 Syncope and collapse: Secondary | ICD-10-CM | POA: Diagnosis not present

## 2016-10-06 DIAGNOSIS — D649 Anemia, unspecified: Secondary | ICD-10-CM | POA: Diagnosis not present

## 2016-10-06 DIAGNOSIS — R0789 Other chest pain: Secondary | ICD-10-CM | POA: Diagnosis not present

## 2016-10-08 ENCOUNTER — Other Ambulatory Visit: Payer: Self-pay | Admitting: *Deleted

## 2016-10-08 NOTE — Patient Outreach (Signed)
[facility]) Care Management  10/08/2016  [address]elene [address] 12/31/1945 [id number]  Insurance referral-HTA:  2 day hospital admission in [facility]:  Telephone call to patient; person answered call & advised that patient was not available. Contact information left with request to have patient return call.  Plan: Will follow up.  Colleen CanLinda Tennyson Wacha, RN BSN CCM Care Management Coordinator [facility] Care [facility]  [phone]

## 2016-10-11 ENCOUNTER — Encounter: Payer: Self-pay | Admitting: *Deleted

## 2016-10-11 ENCOUNTER — Other Ambulatory Visit: Payer: Self-pay | Admitting: *Deleted

## 2016-10-11 NOTE — Patient Outreach (Signed)
[facility]) Care Management  10/11/2016  [name] 10/23/1945 [id number]  Telephone call #2 to patient who was advised of reason for call & [facility].  HIPPA verification received from patient.   Patient voices that he was recently in hospital in [place] for [place] days. States home now & has support from spouse as needed. Voices that he will have follow up appointment with primary care provider 10/13/2016. States he has transportation & is able to drive. States he has no activity restrictions. Voices that he has been able to get prescriptions filled without problems & is taking his medications as instructed by his MD.  States he knows when to all MD & 911 if needed.   Patient voices that he does not need [facility] care management services at this time. He does consent to receive [facility] contact information to use as needed.   Plan: Send [facility] contact information to patient. Send to care management assistant to close out.  Keith Keith Keith Demarest, RN BSN CCM Care Management Coordinator [facility] Care [facility]  [phone]

## 2016-10-13 DIAGNOSIS — D509 Iron deficiency anemia, unspecified: Secondary | ICD-10-CM | POA: Diagnosis not present

## 2016-10-13 DIAGNOSIS — E039 Hypothyroidism, unspecified: Secondary | ICD-10-CM | POA: Diagnosis not present

## 2016-10-13 DIAGNOSIS — I10 Essential (primary) hypertension: Secondary | ICD-10-CM | POA: Diagnosis not present

## 2016-10-13 DIAGNOSIS — Z23 Encounter for immunization: Secondary | ICD-10-CM | POA: Diagnosis not present

## 2016-10-25 DIAGNOSIS — D509 Iron deficiency anemia, unspecified: Secondary | ICD-10-CM | POA: Diagnosis not present

## 2016-11-02 DIAGNOSIS — K640 First degree hemorrhoids: Secondary | ICD-10-CM | POA: Diagnosis not present

## 2016-11-02 DIAGNOSIS — K635 Polyp of colon: Secondary | ICD-10-CM | POA: Diagnosis not present

## 2016-11-02 DIAGNOSIS — D509 Iron deficiency anemia, unspecified: Secondary | ICD-10-CM | POA: Diagnosis not present

## 2016-11-04 DIAGNOSIS — D509 Iron deficiency anemia, unspecified: Secondary | ICD-10-CM | POA: Diagnosis not present

## 2016-11-04 DIAGNOSIS — K635 Polyp of colon: Secondary | ICD-10-CM | POA: Diagnosis not present

## 2016-11-29 DIAGNOSIS — H524 Presbyopia: Secondary | ICD-10-CM | POA: Diagnosis not present

## 2016-11-29 DIAGNOSIS — H2513 Age-related nuclear cataract, bilateral: Secondary | ICD-10-CM | POA: Diagnosis not present

## 2016-12-21 DIAGNOSIS — E039 Hypothyroidism, unspecified: Secondary | ICD-10-CM | POA: Diagnosis not present

## 2017-04-11 DIAGNOSIS — I10 Essential (primary) hypertension: Secondary | ICD-10-CM | POA: Diagnosis not present

## 2017-04-11 DIAGNOSIS — E039 Hypothyroidism, unspecified: Secondary | ICD-10-CM | POA: Diagnosis not present

## 2017-04-11 DIAGNOSIS — E7800 Pure hypercholesterolemia, unspecified: Secondary | ICD-10-CM | POA: Diagnosis not present

## 2017-04-11 DIAGNOSIS — J309 Allergic rhinitis, unspecified: Secondary | ICD-10-CM | POA: Diagnosis not present

## 2017-04-11 DIAGNOSIS — L309 Dermatitis, unspecified: Secondary | ICD-10-CM | POA: Diagnosis not present

## 2017-05-09 DIAGNOSIS — I10 Essential (primary) hypertension: Secondary | ICD-10-CM | POA: Diagnosis not present

## 2017-08-15 DIAGNOSIS — E039 Hypothyroidism, unspecified: Secondary | ICD-10-CM | POA: Diagnosis not present

## 2017-10-17 DIAGNOSIS — I10 Essential (primary) hypertension: Secondary | ICD-10-CM | POA: Diagnosis not present

## 2017-10-17 DIAGNOSIS — E7800 Pure hypercholesterolemia, unspecified: Secondary | ICD-10-CM | POA: Diagnosis not present

## 2017-10-17 DIAGNOSIS — E039 Hypothyroidism, unspecified: Secondary | ICD-10-CM | POA: Diagnosis not present

## 2017-10-17 DIAGNOSIS — J309 Allergic rhinitis, unspecified: Secondary | ICD-10-CM | POA: Diagnosis not present

## 2017-10-17 DIAGNOSIS — L309 Dermatitis, unspecified: Secondary | ICD-10-CM | POA: Diagnosis not present

## 2018-03-06 DIAGNOSIS — H2513 Age-related nuclear cataract, bilateral: Secondary | ICD-10-CM | POA: Diagnosis not present

## 2018-03-06 DIAGNOSIS — H524 Presbyopia: Secondary | ICD-10-CM | POA: Diagnosis not present

## 2018-04-20 DIAGNOSIS — J309 Allergic rhinitis, unspecified: Secondary | ICD-10-CM | POA: Diagnosis not present

## 2018-04-20 DIAGNOSIS — E039 Hypothyroidism, unspecified: Secondary | ICD-10-CM | POA: Diagnosis not present

## 2018-04-20 DIAGNOSIS — L309 Dermatitis, unspecified: Secondary | ICD-10-CM | POA: Diagnosis not present

## 2018-04-20 DIAGNOSIS — E7800 Pure hypercholesterolemia, unspecified: Secondary | ICD-10-CM | POA: Diagnosis not present

## 2018-04-20 DIAGNOSIS — I10 Essential (primary) hypertension: Secondary | ICD-10-CM | POA: Diagnosis not present

## 2018-06-14 DIAGNOSIS — M25552 Pain in left hip: Secondary | ICD-10-CM | POA: Diagnosis not present

## 2018-06-27 DIAGNOSIS — E039 Hypothyroidism, unspecified: Secondary | ICD-10-CM | POA: Diagnosis not present

## 2018-07-06 DIAGNOSIS — M545 Low back pain: Secondary | ICD-10-CM | POA: Diagnosis not present

## 2018-07-06 DIAGNOSIS — E039 Hypothyroidism, unspecified: Secondary | ICD-10-CM | POA: Diagnosis not present

## 2018-08-10 DIAGNOSIS — M5441 Lumbago with sciatica, right side: Secondary | ICD-10-CM | POA: Diagnosis not present

## 2018-08-10 DIAGNOSIS — S39012A Strain of muscle, fascia and tendon of lower back, initial encounter: Secondary | ICD-10-CM | POA: Diagnosis not present

## 2018-08-10 DIAGNOSIS — M545 Low back pain: Secondary | ICD-10-CM | POA: Diagnosis not present

## 2018-08-10 DIAGNOSIS — M25552 Pain in left hip: Secondary | ICD-10-CM | POA: Diagnosis not present

## 2018-08-16 DIAGNOSIS — M4726 Other spondylosis with radiculopathy, lumbar region: Secondary | ICD-10-CM | POA: Diagnosis not present

## 2018-08-16 DIAGNOSIS — S39012D Strain of muscle, fascia and tendon of lower back, subsequent encounter: Secondary | ICD-10-CM | POA: Diagnosis not present

## 2018-08-22 DIAGNOSIS — S39012D Strain of muscle, fascia and tendon of lower back, subsequent encounter: Secondary | ICD-10-CM | POA: Diagnosis not present

## 2018-08-22 DIAGNOSIS — M4726 Other spondylosis with radiculopathy, lumbar region: Secondary | ICD-10-CM | POA: Diagnosis not present

## 2018-08-24 DIAGNOSIS — M4726 Other spondylosis with radiculopathy, lumbar region: Secondary | ICD-10-CM | POA: Diagnosis not present

## 2018-08-24 DIAGNOSIS — S39012D Strain of muscle, fascia and tendon of lower back, subsequent encounter: Secondary | ICD-10-CM | POA: Diagnosis not present

## 2018-08-28 DIAGNOSIS — S39012D Strain of muscle, fascia and tendon of lower back, subsequent encounter: Secondary | ICD-10-CM | POA: Diagnosis not present

## 2018-08-28 DIAGNOSIS — M4726 Other spondylosis with radiculopathy, lumbar region: Secondary | ICD-10-CM | POA: Diagnosis not present

## 2018-08-30 DIAGNOSIS — M4726 Other spondylosis with radiculopathy, lumbar region: Secondary | ICD-10-CM | POA: Diagnosis not present

## 2018-08-30 DIAGNOSIS — S39012D Strain of muscle, fascia and tendon of lower back, subsequent encounter: Secondary | ICD-10-CM | POA: Diagnosis not present

## 2018-08-31 DIAGNOSIS — M4726 Other spondylosis with radiculopathy, lumbar region: Secondary | ICD-10-CM | POA: Diagnosis not present

## 2018-08-31 DIAGNOSIS — M5442 Lumbago with sciatica, left side: Secondary | ICD-10-CM | POA: Diagnosis not present

## 2018-08-31 DIAGNOSIS — M545 Low back pain: Secondary | ICD-10-CM | POA: Diagnosis not present

## 2018-09-05 DIAGNOSIS — S39012D Strain of muscle, fascia and tendon of lower back, subsequent encounter: Secondary | ICD-10-CM | POA: Diagnosis not present

## 2018-09-05 DIAGNOSIS — M4726 Other spondylosis with radiculopathy, lumbar region: Secondary | ICD-10-CM | POA: Diagnosis not present

## 2018-09-06 DIAGNOSIS — E039 Hypothyroidism, unspecified: Secondary | ICD-10-CM | POA: Diagnosis not present

## 2018-09-07 DIAGNOSIS — S39012D Strain of muscle, fascia and tendon of lower back, subsequent encounter: Secondary | ICD-10-CM | POA: Diagnosis not present

## 2018-09-07 DIAGNOSIS — M4726 Other spondylosis with radiculopathy, lumbar region: Secondary | ICD-10-CM | POA: Diagnosis not present

## 2018-09-12 DIAGNOSIS — S39012D Strain of muscle, fascia and tendon of lower back, subsequent encounter: Secondary | ICD-10-CM | POA: Diagnosis not present

## 2018-09-12 DIAGNOSIS — M4726 Other spondylosis with radiculopathy, lumbar region: Secondary | ICD-10-CM | POA: Diagnosis not present

## 2018-09-14 DIAGNOSIS — S39012D Strain of muscle, fascia and tendon of lower back, subsequent encounter: Secondary | ICD-10-CM | POA: Diagnosis not present

## 2018-09-14 DIAGNOSIS — M4726 Other spondylosis with radiculopathy, lumbar region: Secondary | ICD-10-CM | POA: Diagnosis not present

## 2018-09-19 DIAGNOSIS — M4726 Other spondylosis with radiculopathy, lumbar region: Secondary | ICD-10-CM | POA: Diagnosis not present

## 2018-09-19 DIAGNOSIS — S39012D Strain of muscle, fascia and tendon of lower back, subsequent encounter: Secondary | ICD-10-CM | POA: Diagnosis not present

## 2018-09-22 DIAGNOSIS — S39012D Strain of muscle, fascia and tendon of lower back, subsequent encounter: Secondary | ICD-10-CM | POA: Diagnosis not present

## 2018-09-22 DIAGNOSIS — M4726 Other spondylosis with radiculopathy, lumbar region: Secondary | ICD-10-CM | POA: Diagnosis not present

## 2018-09-26 DIAGNOSIS — M4726 Other spondylosis with radiculopathy, lumbar region: Secondary | ICD-10-CM | POA: Diagnosis not present

## 2018-09-26 DIAGNOSIS — S39012D Strain of muscle, fascia and tendon of lower back, subsequent encounter: Secondary | ICD-10-CM | POA: Diagnosis not present

## 2018-09-28 DIAGNOSIS — M4726 Other spondylosis with radiculopathy, lumbar region: Secondary | ICD-10-CM | POA: Diagnosis not present

## 2018-10-25 DIAGNOSIS — E7800 Pure hypercholesterolemia, unspecified: Secondary | ICD-10-CM | POA: Diagnosis not present

## 2018-10-25 DIAGNOSIS — L309 Dermatitis, unspecified: Secondary | ICD-10-CM | POA: Diagnosis not present

## 2018-10-25 DIAGNOSIS — E039 Hypothyroidism, unspecified: Secondary | ICD-10-CM | POA: Diagnosis not present

## 2018-10-25 DIAGNOSIS — I10 Essential (primary) hypertension: Secondary | ICD-10-CM | POA: Diagnosis not present

## 2018-10-25 DIAGNOSIS — J309 Allergic rhinitis, unspecified: Secondary | ICD-10-CM | POA: Diagnosis not present

## 2018-10-25 DIAGNOSIS — M545 Low back pain: Secondary | ICD-10-CM | POA: Diagnosis not present

## 2018-11-01 DIAGNOSIS — Z23 Encounter for immunization: Secondary | ICD-10-CM | POA: Diagnosis not present

## 2018-11-01 DIAGNOSIS — I10 Essential (primary) hypertension: Secondary | ICD-10-CM | POA: Diagnosis not present

## 2019-01-02 DIAGNOSIS — M7632 Iliotibial band syndrome, left leg: Secondary | ICD-10-CM | POA: Diagnosis not present

## 2019-01-09 DIAGNOSIS — M7632 Iliotibial band syndrome, left leg: Secondary | ICD-10-CM | POA: Diagnosis not present

## 2019-01-09 DIAGNOSIS — M5416 Radiculopathy, lumbar region: Secondary | ICD-10-CM | POA: Diagnosis not present

## 2019-01-17 DIAGNOSIS — M5416 Radiculopathy, lumbar region: Secondary | ICD-10-CM | POA: Diagnosis not present

## 2019-01-17 DIAGNOSIS — M7632 Iliotibial band syndrome, left leg: Secondary | ICD-10-CM | POA: Diagnosis not present

## 2019-01-23 DIAGNOSIS — M5416 Radiculopathy, lumbar region: Secondary | ICD-10-CM | POA: Diagnosis not present

## 2019-01-23 DIAGNOSIS — M7632 Iliotibial band syndrome, left leg: Secondary | ICD-10-CM | POA: Diagnosis not present

## 2019-01-30 DIAGNOSIS — M7632 Iliotibial band syndrome, left leg: Secondary | ICD-10-CM | POA: Diagnosis not present

## 2019-01-30 DIAGNOSIS — M5416 Radiculopathy, lumbar region: Secondary | ICD-10-CM | POA: Diagnosis not present

## 2019-02-06 DIAGNOSIS — M5416 Radiculopathy, lumbar region: Secondary | ICD-10-CM | POA: Diagnosis not present

## 2019-02-06 DIAGNOSIS — M7632 Iliotibial band syndrome, left leg: Secondary | ICD-10-CM | POA: Diagnosis not present

## 2019-02-13 DIAGNOSIS — M7061 Trochanteric bursitis, right hip: Secondary | ICD-10-CM | POA: Diagnosis not present

## 2019-02-15 DIAGNOSIS — M7632 Iliotibial band syndrome, left leg: Secondary | ICD-10-CM | POA: Diagnosis not present

## 2019-02-15 DIAGNOSIS — M5416 Radiculopathy, lumbar region: Secondary | ICD-10-CM | POA: Diagnosis not present

## 2019-03-12 DIAGNOSIS — H2513 Age-related nuclear cataract, bilateral: Secondary | ICD-10-CM | POA: Diagnosis not present

## 2019-03-12 DIAGNOSIS — H524 Presbyopia: Secondary | ICD-10-CM | POA: Diagnosis not present

## 2019-03-27 DIAGNOSIS — H5203 Hypermetropia, bilateral: Secondary | ICD-10-CM | POA: Diagnosis not present

## 2019-03-27 DIAGNOSIS — H524 Presbyopia: Secondary | ICD-10-CM | POA: Diagnosis not present

## 2019-03-27 DIAGNOSIS — H52209 Unspecified astigmatism, unspecified eye: Secondary | ICD-10-CM | POA: Diagnosis not present

## 2019-04-09 DIAGNOSIS — I10 Essential (primary) hypertension: Secondary | ICD-10-CM | POA: Diagnosis not present

## 2019-05-01 DIAGNOSIS — L309 Dermatitis, unspecified: Secondary | ICD-10-CM | POA: Diagnosis not present

## 2019-05-01 DIAGNOSIS — E039 Hypothyroidism, unspecified: Secondary | ICD-10-CM | POA: Diagnosis not present

## 2019-05-01 DIAGNOSIS — E7800 Pure hypercholesterolemia, unspecified: Secondary | ICD-10-CM | POA: Diagnosis not present

## 2019-05-01 DIAGNOSIS — I10 Essential (primary) hypertension: Secondary | ICD-10-CM | POA: Diagnosis not present

## 2019-05-01 DIAGNOSIS — J309 Allergic rhinitis, unspecified: Secondary | ICD-10-CM | POA: Diagnosis not present

## 2019-08-14 DIAGNOSIS — Z008 Encounter for other general examination: Secondary | ICD-10-CM | POA: Diagnosis not present

## 2019-08-14 DIAGNOSIS — E039 Hypothyroidism, unspecified: Secondary | ICD-10-CM | POA: Diagnosis not present

## 2019-08-14 DIAGNOSIS — Z7982 Long term (current) use of aspirin: Secondary | ICD-10-CM | POA: Diagnosis not present

## 2019-08-14 DIAGNOSIS — M1990 Unspecified osteoarthritis, unspecified site: Secondary | ICD-10-CM | POA: Diagnosis not present

## 2019-08-14 DIAGNOSIS — E785 Hyperlipidemia, unspecified: Secondary | ICD-10-CM | POA: Diagnosis not present

## 2019-08-14 DIAGNOSIS — I10 Essential (primary) hypertension: Secondary | ICD-10-CM | POA: Diagnosis not present

## 2019-08-14 DIAGNOSIS — Z8249 Family history of ischemic heart disease and other diseases of the circulatory system: Secondary | ICD-10-CM | POA: Diagnosis not present

## 2019-08-14 DIAGNOSIS — Z87891 Personal history of nicotine dependence: Secondary | ICD-10-CM | POA: Diagnosis not present

## 2019-11-19 DIAGNOSIS — L309 Dermatitis, unspecified: Secondary | ICD-10-CM | POA: Diagnosis not present

## 2019-11-19 DIAGNOSIS — E039 Hypothyroidism, unspecified: Secondary | ICD-10-CM | POA: Diagnosis not present

## 2019-11-19 DIAGNOSIS — M6530 Trigger finger, unspecified finger: Secondary | ICD-10-CM | POA: Diagnosis not present

## 2019-11-19 DIAGNOSIS — E7800 Pure hypercholesterolemia, unspecified: Secondary | ICD-10-CM | POA: Diagnosis not present

## 2019-11-19 DIAGNOSIS — J309 Allergic rhinitis, unspecified: Secondary | ICD-10-CM | POA: Diagnosis not present

## 2019-11-19 DIAGNOSIS — Z23 Encounter for immunization: Secondary | ICD-10-CM | POA: Diagnosis not present

## 2019-11-19 DIAGNOSIS — I10 Essential (primary) hypertension: Secondary | ICD-10-CM | POA: Diagnosis not present

## 2020-03-17 DIAGNOSIS — H2513 Age-related nuclear cataract, bilateral: Secondary | ICD-10-CM | POA: Diagnosis not present

## 2020-03-17 DIAGNOSIS — H524 Presbyopia: Secondary | ICD-10-CM | POA: Diagnosis not present

## 2020-05-12 DIAGNOSIS — I10 Essential (primary) hypertension: Secondary | ICD-10-CM | POA: Diagnosis not present

## 2020-05-12 DIAGNOSIS — E039 Hypothyroidism, unspecified: Secondary | ICD-10-CM | POA: Diagnosis not present

## 2020-05-12 DIAGNOSIS — M6530 Trigger finger, unspecified finger: Secondary | ICD-10-CM | POA: Diagnosis not present

## 2020-05-12 DIAGNOSIS — Z0000 Encounter for general adult medical examination without abnormal findings: Secondary | ICD-10-CM | POA: Diagnosis not present

## 2020-05-12 DIAGNOSIS — Z23 Encounter for immunization: Secondary | ICD-10-CM | POA: Diagnosis not present

## 2020-05-12 DIAGNOSIS — E7800 Pure hypercholesterolemia, unspecified: Secondary | ICD-10-CM | POA: Diagnosis not present

## 2020-05-12 DIAGNOSIS — J309 Allergic rhinitis, unspecified: Secondary | ICD-10-CM | POA: Diagnosis not present

## 2020-05-12 DIAGNOSIS — L309 Dermatitis, unspecified: Secondary | ICD-10-CM | POA: Diagnosis not present

## 2020-06-03 DIAGNOSIS — M65341 Trigger finger, right ring finger: Secondary | ICD-10-CM | POA: Diagnosis not present

## 2020-06-09 DIAGNOSIS — M545 Low back pain, unspecified: Secondary | ICD-10-CM | POA: Diagnosis not present

## 2020-06-09 DIAGNOSIS — M7062 Trochanteric bursitis, left hip: Secondary | ICD-10-CM | POA: Diagnosis not present

## 2020-07-01 DIAGNOSIS — M65341 Trigger finger, right ring finger: Secondary | ICD-10-CM | POA: Diagnosis not present

## 2020-07-11 DIAGNOSIS — E039 Hypothyroidism, unspecified: Secondary | ICD-10-CM | POA: Diagnosis not present

## 2020-07-29 DIAGNOSIS — M65341 Trigger finger, right ring finger: Secondary | ICD-10-CM | POA: Diagnosis not present

## 2020-08-11 DIAGNOSIS — M25511 Pain in right shoulder: Secondary | ICD-10-CM | POA: Diagnosis not present

## 2020-08-11 DIAGNOSIS — W19XXXA Unspecified fall, initial encounter: Secondary | ICD-10-CM | POA: Diagnosis not present

## 2020-09-02 DIAGNOSIS — M7062 Trochanteric bursitis, left hip: Secondary | ICD-10-CM | POA: Diagnosis not present

## 2020-09-02 DIAGNOSIS — M545 Low back pain, unspecified: Secondary | ICD-10-CM | POA: Diagnosis not present

## 2020-09-08 DIAGNOSIS — E039 Hypothyroidism, unspecified: Secondary | ICD-10-CM | POA: Diagnosis not present

## 2020-09-15 DIAGNOSIS — M545 Low back pain, unspecified: Secondary | ICD-10-CM | POA: Diagnosis not present

## 2020-09-23 DIAGNOSIS — M545 Low back pain, unspecified: Secondary | ICD-10-CM | POA: Diagnosis not present

## 2020-09-23 DIAGNOSIS — M5442 Lumbago with sciatica, left side: Secondary | ICD-10-CM | POA: Diagnosis not present

## 2020-10-13 DIAGNOSIS — M5416 Radiculopathy, lumbar region: Secondary | ICD-10-CM | POA: Diagnosis not present

## 2020-11-03 DIAGNOSIS — J309 Allergic rhinitis, unspecified: Secondary | ICD-10-CM | POA: Diagnosis not present

## 2020-11-03 DIAGNOSIS — Z23 Encounter for immunization: Secondary | ICD-10-CM | POA: Diagnosis not present

## 2020-11-03 DIAGNOSIS — M545 Low back pain, unspecified: Secondary | ICD-10-CM | POA: Diagnosis not present

## 2020-11-03 DIAGNOSIS — E7800 Pure hypercholesterolemia, unspecified: Secondary | ICD-10-CM | POA: Diagnosis not present

## 2020-11-03 DIAGNOSIS — L309 Dermatitis, unspecified: Secondary | ICD-10-CM | POA: Diagnosis not present

## 2020-11-03 DIAGNOSIS — E039 Hypothyroidism, unspecified: Secondary | ICD-10-CM | POA: Diagnosis not present

## 2020-11-03 DIAGNOSIS — I10 Essential (primary) hypertension: Secondary | ICD-10-CM | POA: Diagnosis not present

## 2020-11-13 DIAGNOSIS — M5416 Radiculopathy, lumbar region: Secondary | ICD-10-CM | POA: Diagnosis not present

## 2020-11-17 DIAGNOSIS — I10 Essential (primary) hypertension: Secondary | ICD-10-CM | POA: Diagnosis not present

## 2020-11-17 DIAGNOSIS — E039 Hypothyroidism, unspecified: Secondary | ICD-10-CM | POA: Diagnosis not present

## 2020-11-17 DIAGNOSIS — I2510 Atherosclerotic heart disease of native coronary artery without angina pectoris: Secondary | ICD-10-CM | POA: Diagnosis not present

## 2020-11-17 DIAGNOSIS — M1990 Unspecified osteoarthritis, unspecified site: Secondary | ICD-10-CM | POA: Diagnosis not present

## 2020-11-17 DIAGNOSIS — Z8249 Family history of ischemic heart disease and other diseases of the circulatory system: Secondary | ICD-10-CM | POA: Diagnosis not present

## 2020-11-17 DIAGNOSIS — Z888 Allergy status to other drugs, medicaments and biological substances status: Secondary | ICD-10-CM | POA: Diagnosis not present

## 2020-11-17 DIAGNOSIS — E785 Hyperlipidemia, unspecified: Secondary | ICD-10-CM | POA: Diagnosis not present

## 2020-11-17 DIAGNOSIS — Z87891 Personal history of nicotine dependence: Secondary | ICD-10-CM | POA: Diagnosis not present

## 2021-03-17 DIAGNOSIS — H5203 Hypermetropia, bilateral: Secondary | ICD-10-CM | POA: Diagnosis not present

## 2021-03-17 DIAGNOSIS — H2513 Age-related nuclear cataract, bilateral: Secondary | ICD-10-CM | POA: Diagnosis not present

## 2021-05-18 DIAGNOSIS — J309 Allergic rhinitis, unspecified: Secondary | ICD-10-CM | POA: Diagnosis not present

## 2021-05-18 DIAGNOSIS — E039 Hypothyroidism, unspecified: Secondary | ICD-10-CM | POA: Diagnosis not present

## 2021-05-18 DIAGNOSIS — R609 Edema, unspecified: Secondary | ICD-10-CM | POA: Diagnosis not present

## 2021-05-18 DIAGNOSIS — Z23 Encounter for immunization: Secondary | ICD-10-CM | POA: Diagnosis not present

## 2021-05-18 DIAGNOSIS — L309 Dermatitis, unspecified: Secondary | ICD-10-CM | POA: Diagnosis not present

## 2021-05-18 DIAGNOSIS — I10 Essential (primary) hypertension: Secondary | ICD-10-CM | POA: Diagnosis not present

## 2021-05-18 DIAGNOSIS — Z0000 Encounter for general adult medical examination without abnormal findings: Secondary | ICD-10-CM | POA: Diagnosis not present

## 2021-05-18 DIAGNOSIS — E7800 Pure hypercholesterolemia, unspecified: Secondary | ICD-10-CM | POA: Diagnosis not present

## 2021-05-18 DIAGNOSIS — D509 Iron deficiency anemia, unspecified: Secondary | ICD-10-CM | POA: Diagnosis not present

## 2021-05-18 DIAGNOSIS — M545 Low back pain, unspecified: Secondary | ICD-10-CM | POA: Diagnosis not present

## 2021-07-28 DIAGNOSIS — M5442 Lumbago with sciatica, left side: Secondary | ICD-10-CM | POA: Diagnosis not present

## 2021-08-17 DIAGNOSIS — M5416 Radiculopathy, lumbar region: Secondary | ICD-10-CM | POA: Diagnosis not present

## 2021-09-03 DIAGNOSIS — M5416 Radiculopathy, lumbar region: Secondary | ICD-10-CM | POA: Diagnosis not present

## 2021-11-16 DIAGNOSIS — M545 Low back pain, unspecified: Secondary | ICD-10-CM | POA: Diagnosis not present

## 2021-11-16 DIAGNOSIS — E7800 Pure hypercholesterolemia, unspecified: Secondary | ICD-10-CM | POA: Diagnosis not present

## 2021-11-16 DIAGNOSIS — E039 Hypothyroidism, unspecified: Secondary | ICD-10-CM | POA: Diagnosis not present

## 2021-11-16 DIAGNOSIS — L309 Dermatitis, unspecified: Secondary | ICD-10-CM | POA: Diagnosis not present

## 2021-11-16 DIAGNOSIS — I10 Essential (primary) hypertension: Secondary | ICD-10-CM | POA: Diagnosis not present

## 2021-11-16 DIAGNOSIS — J309 Allergic rhinitis, unspecified: Secondary | ICD-10-CM | POA: Diagnosis not present

## 2021-12-10 DIAGNOSIS — M7062 Trochanteric bursitis, left hip: Secondary | ICD-10-CM | POA: Diagnosis not present

## 2021-12-14 DIAGNOSIS — H259 Unspecified age-related cataract: Secondary | ICD-10-CM | POA: Diagnosis not present

## 2021-12-14 DIAGNOSIS — Z87891 Personal history of nicotine dependence: Secondary | ICD-10-CM | POA: Diagnosis not present

## 2021-12-14 DIAGNOSIS — Z008 Encounter for other general examination: Secondary | ICD-10-CM | POA: Diagnosis not present

## 2021-12-14 DIAGNOSIS — Z833 Family history of diabetes mellitus: Secondary | ICD-10-CM | POA: Diagnosis not present

## 2021-12-14 DIAGNOSIS — I10 Essential (primary) hypertension: Secondary | ICD-10-CM | POA: Diagnosis not present

## 2021-12-14 DIAGNOSIS — Z791 Long term (current) use of non-steroidal anti-inflammatories (NSAID): Secondary | ICD-10-CM | POA: Diagnosis not present

## 2021-12-14 DIAGNOSIS — I2510 Atherosclerotic heart disease of native coronary artery without angina pectoris: Secondary | ICD-10-CM | POA: Diagnosis not present

## 2021-12-14 DIAGNOSIS — E039 Hypothyroidism, unspecified: Secondary | ICD-10-CM | POA: Diagnosis not present

## 2021-12-14 DIAGNOSIS — E785 Hyperlipidemia, unspecified: Secondary | ICD-10-CM | POA: Diagnosis not present

## 2021-12-14 DIAGNOSIS — M1990 Unspecified osteoarthritis, unspecified site: Secondary | ICD-10-CM | POA: Diagnosis not present

## 2021-12-18 DIAGNOSIS — M5416 Radiculopathy, lumbar region: Secondary | ICD-10-CM | POA: Diagnosis not present

## 2022-03-29 DIAGNOSIS — H524 Presbyopia: Secondary | ICD-10-CM | POA: Diagnosis not present

## 2022-03-29 DIAGNOSIS — H2513 Age-related nuclear cataract, bilateral: Secondary | ICD-10-CM | POA: Diagnosis not present

## 2022-05-17 DIAGNOSIS — J309 Allergic rhinitis, unspecified: Secondary | ICD-10-CM | POA: Diagnosis not present

## 2022-05-17 DIAGNOSIS — E7800 Pure hypercholesterolemia, unspecified: Secondary | ICD-10-CM | POA: Diagnosis not present

## 2022-05-17 DIAGNOSIS — L309 Dermatitis, unspecified: Secondary | ICD-10-CM | POA: Diagnosis not present

## 2022-05-17 DIAGNOSIS — E039 Hypothyroidism, unspecified: Secondary | ICD-10-CM | POA: Diagnosis not present

## 2022-05-17 DIAGNOSIS — I10 Essential (primary) hypertension: Secondary | ICD-10-CM | POA: Diagnosis not present

## 2022-05-17 DIAGNOSIS — M545 Low back pain, unspecified: Secondary | ICD-10-CM | POA: Diagnosis not present

## 2022-05-17 DIAGNOSIS — D509 Iron deficiency anemia, unspecified: Secondary | ICD-10-CM | POA: Diagnosis not present

## 2022-08-11 DIAGNOSIS — M7989 Other specified soft tissue disorders: Secondary | ICD-10-CM | POA: Diagnosis not present

## 2022-08-12 ENCOUNTER — Other Ambulatory Visit (HOSPITAL_COMMUNITY): Payer: Self-pay | Admitting: Family Medicine

## 2022-08-12 ENCOUNTER — Ambulatory Visit (HOSPITAL_COMMUNITY)
Admission: RE | Admit: 2022-08-12 | Discharge: 2022-08-12 | Disposition: A | Discharge status: Home / Self Care | Payer: Medicare HMO | Source: Ambulatory Visit | Attending: Family Medicine | Admitting: Family Medicine

## 2022-08-12 DIAGNOSIS — M7989 Other specified soft tissue disorders: Secondary | ICD-10-CM | POA: Insufficient documentation

## 2022-08-25 DIAGNOSIS — R591 Generalized enlarged lymph nodes: Secondary | ICD-10-CM | POA: Diagnosis not present

## 2022-08-25 DIAGNOSIS — R600 Localized edema: Secondary | ICD-10-CM | POA: Diagnosis not present

## 2022-08-26 DIAGNOSIS — E039 Hypothyroidism, unspecified: Secondary | ICD-10-CM | POA: Diagnosis not present

## 2022-08-26 DIAGNOSIS — Z87891 Personal history of nicotine dependence: Secondary | ICD-10-CM | POA: Diagnosis not present

## 2022-08-26 DIAGNOSIS — Z8249 Family history of ischemic heart disease and other diseases of the circulatory system: Secondary | ICD-10-CM | POA: Diagnosis not present

## 2022-08-26 DIAGNOSIS — I10 Essential (primary) hypertension: Secondary | ICD-10-CM | POA: Diagnosis not present

## 2022-08-26 DIAGNOSIS — Z9181 History of falling: Secondary | ICD-10-CM | POA: Diagnosis not present

## 2022-08-26 DIAGNOSIS — J309 Allergic rhinitis, unspecified: Secondary | ICD-10-CM | POA: Diagnosis not present

## 2022-08-26 DIAGNOSIS — E785 Hyperlipidemia, unspecified: Secondary | ICD-10-CM | POA: Diagnosis not present

## 2022-08-26 DIAGNOSIS — I872 Venous insufficiency (chronic) (peripheral): Secondary | ICD-10-CM | POA: Diagnosis not present

## 2022-08-26 DIAGNOSIS — I739 Peripheral vascular disease, unspecified: Secondary | ICD-10-CM | POA: Diagnosis not present

## 2022-08-26 DIAGNOSIS — Z809 Family history of malignant neoplasm, unspecified: Secondary | ICD-10-CM | POA: Diagnosis not present

## 2022-08-26 DIAGNOSIS — M1990 Unspecified osteoarthritis, unspecified site: Secondary | ICD-10-CM | POA: Diagnosis not present

## 2022-08-26 DIAGNOSIS — N529 Male erectile dysfunction, unspecified: Secondary | ICD-10-CM | POA: Diagnosis not present

## 2022-09-30 DIAGNOSIS — R591 Generalized enlarged lymph nodes: Secondary | ICD-10-CM | POA: Diagnosis not present

## 2022-11-16 DIAGNOSIS — E7800 Pure hypercholesterolemia, unspecified: Secondary | ICD-10-CM | POA: Diagnosis not present

## 2022-11-16 DIAGNOSIS — I10 Essential (primary) hypertension: Secondary | ICD-10-CM | POA: Diagnosis not present

## 2022-11-16 DIAGNOSIS — D509 Iron deficiency anemia, unspecified: Secondary | ICD-10-CM | POA: Diagnosis not present

## 2023-01-07 ENCOUNTER — Inpatient Hospital Stay: Payer: Medicare HMO | Attending: Hematology & Oncology

## 2023-01-07 ENCOUNTER — Inpatient Hospital Stay (HOSPITAL_BASED_OUTPATIENT_CLINIC_OR_DEPARTMENT_OTHER): Payer: Medicare HMO | Admitting: Hematology & Oncology

## 2023-01-07 ENCOUNTER — Other Ambulatory Visit: Payer: Self-pay

## 2023-01-07 ENCOUNTER — Encounter: Payer: Self-pay | Admitting: Hematology & Oncology

## 2023-01-07 VITALS — BP 143/70 | HR 58 | Temp 97.7°F | Resp 18 | Ht 74.0 in | Wt 206.0 lb

## 2023-01-07 DIAGNOSIS — D72819 Decreased white blood cell count, unspecified: Secondary | ICD-10-CM | POA: Diagnosis not present

## 2023-01-07 DIAGNOSIS — R718 Other abnormality of red blood cells: Secondary | ICD-10-CM | POA: Diagnosis not present

## 2023-01-07 LAB — CBC WITH DIFFERENTIAL (CANCER CENTER ONLY)
Abs Immature Granulocytes: 0.01 10*3/uL (ref 0.00–0.07)
Basophils Absolute: 0 10*3/uL (ref 0.0–0.1)
Basophils Relative: 1 %
Eosinophils Absolute: 0.2 10*3/uL (ref 0.0–0.5)
Eosinophils Relative: 7 %
HCT: 41 % (ref 39.0–52.0)
Hemoglobin: 12.8 g/dL — ABNORMAL LOW (ref 13.0–17.0)
Immature Granulocytes: 0 %
Lymphocytes Relative: 44 %
Lymphs Abs: 1.5 10*3/uL (ref 0.7–4.0)
MCH: 23.8 pg — ABNORMAL LOW (ref 26.0–34.0)
MCHC: 31.2 g/dL (ref 30.0–36.0)
MCV: 76.2 fL — ABNORMAL LOW (ref 80.0–100.0)
Monocytes Absolute: 0.5 10*3/uL (ref 0.1–1.0)
Monocytes Relative: 15 %
Neutro Abs: 1.1 10*3/uL — ABNORMAL LOW (ref 1.7–7.7)
Neutrophils Relative %: 33 %
Platelet Count: 194 10*3/uL (ref 150–400)
RBC: 5.38 MIL/uL (ref 4.22–5.81)
RDW: 14.6 % (ref 11.5–15.5)
WBC Count: 3.4 10*3/uL — ABNORMAL LOW (ref 4.0–10.5)
nRBC: 0 % (ref 0.0–0.2)

## 2023-01-07 LAB — LACTATE DEHYDROGENASE: LDH: 194 U/L — ABNORMAL HIGH (ref 98–192)

## 2023-01-07 LAB — SAVE SMEAR(SSMR), FOR PROVIDER SLIDE REVIEW

## 2023-01-07 NOTE — Progress Notes (Signed)
Hematology and Oncology Follow Up Visit  [name] [id number] 01-07-1946 77 y.o. 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  Have not seen him for quite a long time.  When I first saw him, it was because of mild leukopenia.  He still has the leukopenia.  He has been doing well.  He really has had no complaints.  He has a little bit of swelling in the right ankle.  Again this is chronic.  He has had no problems with weight loss or weight gain.  He is not a vegetarian.  He has had no change in bowel or bladder habits.  He has had no cough or shortness of breath.  He has had no nausea.  His wife says that he is does rest a little bit more often.  He has had no rashes.  He has had no swollen lymph nodes.  He has been followed by his family doctor.  The family doctor was a little bit worried about his white cell count dropping a little bit.  As such, he was currently referred back to the [facility].  He does not smoke.  He does not drink.  Currently, he is retired.  He was a Naval architect.  I would have to say that his performance status is ECOG 1.    Medications:  Current Outpatient Medications:    EUTHYROX 125 MCG tablet, Take 125 mcg by mouth every morning., Disp: , Rfl:    ferrous sulfate 325 (65 FE) MG EC tablet, Take 325 mg by mouth daily., Disp: , Rfl:    hydrochlorothiazide (HYDRODIURIL) 25 MG tablet, Take 25 mg by mouth as needed., Disp: , Rfl:    pravastatin (PRAVACHOL) 40 MG tablet, Take 40 mg by mouth daily., Disp: , Rfl:    diltiazem (CARDIZEM CD) 180 MG 24 hr capsule, Take 360 mg by mouth daily., Disp: , Rfl:    lisinopril (ZESTRIL) 20 MG tablet, Take 20 mg by mouth daily., Disp: , Rfl:   Allergies: No Known Allergies  Past Medical History, Surgical history, Social history, and Family History were reviewed and  updated.  Review of Systems: Review of Systems  Constitutional:  Positive for fatigue.  HENT:  Negative.    Eyes: Negative.   Respiratory: Negative.    Cardiovascular:  Positive for leg swelling.  Gastrointestinal: Negative.   Endocrine: Negative.   Genitourinary: Negative.    Musculoskeletal: Negative.   Skin: Negative.   Neurological: Negative.   Hematological: Negative.   Psychiatric/Behavioral: Negative.      Physical Exam:  height is 6\' 2"  (1.88 m) and weight is 206 lb (93.4 kg). His oral temperature is 97.7 F (36.5 C). His blood pressure is 143/70 (abnormal) and his pulse is 58 (abnormal). His respiration is 18 and oxygen saturation is 100%.   Wt Readings from Last 3 Encounters:  01/07/23 206 lb (93.4 kg)    Physical Exam Vitals reviewed.  HENT:     Head: Normocephalic and atraumatic.  Eyes:     Pupils: Pupils are equal, round, and reactive to light.  Cardiovascular:     Rate and Rhythm: Normal rate and regular rhythm.     Heart sounds: Normal heart sounds.  Pulmonary:     Effort: Pulmonary effort is normal.     Breath sounds: Normal breath sounds.  Abdominal:  General: Bowel sounds are normal.     Palpations: Abdomen is soft.     Comments: Abdominal exam is soft.  He has decent bowel sounds.  There is no fluid wave.  His liver edge might be at the right costal margin.  I really cannot palpate spleen.  Musculoskeletal:        General: No tenderness or deformity. Normal range of motion.     Cervical back: Normal range of motion.     Comments: He does have some 1-2+ edema in the right ankle.  Lymphadenopathy:     Cervical: No cervical adenopathy.  Skin:    General: Skin is warm and dry.     Findings: No erythema or rash.  Neurological:     Mental Status: He is alert and oriented to person, place, and time.  Psychiatric:        Behavior: Behavior normal.        Thought Content: Thought content normal.        Judgment: Judgment normal.      Lab  Results  Component Value Date   WBC 3.4 (L) 01/07/2023   HGB 12.8 (L) 01/07/2023   HCT 41.0 01/07/2023   MCV 76.2 (L) 01/07/2023   PLT 194 01/07/2023     Chemistry      Component Value Date/Time   NA 141 06/23/2007 0425   K 3.8 06/23/2007 0425   CL 108 06/23/2007 0425   CO2 28 06/23/2007 0425   BUN 4 (L) 06/23/2007 0425   CREATININE 0.94 06/23/2007 0425      Component Value Date/Time   CALCIUM 9.1 06/23/2007 0425   ALKPHOS 57 06/22/2007 1035   AST 24 06/22/2007 1035   ALT 16 06/22/2007 1035   BILITOT 0.8 06/22/2007 1035     Peripheral blood smear shows mild anisocytosis and poikilocytosis.  He did have quite a few elliptocytes.  I do not see any nucleated red blood cells.  There is a couple schistocytes.  He has no target cells.  There is no clumping.  White blood cells are slightly decreased in number.  His white blood cells appear mature.  He has no hypersegmented polys.  I do not see any blasts.  Platelets are adequate in number and size.  Platelets are well granulated.   Impression and Plan: [name] is a very nice 77 year old African-American male.  I have to have seen him in the past.  Again, his white cell count really is not changed over 14-15 years.  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  However, we will be very diligent with following this.  I know that he certainly would be at risk for myelodysplasia.  We will go ahead and get a ultrasound of his abdomen.  Again I am not sure if he has enlargement of his liver.  I think it be important for [place] to look at this.  His blood smear and certainly is not that remarkable.  I do not see anything that warrants [place] to do a bone marrow biopsy on him right now.  I really do not think we have to do any NGS studies.  I would like to see him back after the Winter.  [place].  I really do want him to come here in the wintertime when there might be some some bad weather.  As always, we had good  fellowship.     Josph Macho, MD 12/6/20243:12 PM

## 2023-01-19 ENCOUNTER — Ambulatory Visit (HOSPITAL_COMMUNITY)
Admission: RE | Admit: 2023-01-19 | Discharge: 2023-01-19 | Disposition: A | Discharge status: Home / Self Care | Payer: Medicare HMO | Source: Ambulatory Visit | Attending: Hematology & Oncology | Admitting: Hematology & Oncology

## 2023-01-19 ENCOUNTER — Telehealth: Payer: Self-pay | Admitting: *Deleted

## 2023-01-19 DIAGNOSIS — N281 Cyst of kidney, acquired: Secondary | ICD-10-CM | POA: Diagnosis not present

## 2023-01-19 DIAGNOSIS — D72819 Decreased white blood cell count, unspecified: Secondary | ICD-10-CM | POA: Insufficient documentation

## 2023-01-19 NOTE — Telephone Encounter (Signed)
As noted below by Dr. Myna Hidalgo, I informed the spouse that the ultrasound is normal and the liver looks fine. She verbalized understanding.

## 2023-01-19 NOTE — Telephone Encounter (Signed)
-----   Message from Josph Macho sent at 01/19/2023  8:12 AM EST ----- Please call and let him know that the ultrasound is relatively normal.  Liver looks fine.

## 2023-02-18 DIAGNOSIS — I10 Essential (primary) hypertension: Secondary | ICD-10-CM | POA: Diagnosis not present

## 2023-05-24 DIAGNOSIS — E039 Hypothyroidism, unspecified: Secondary | ICD-10-CM | POA: Diagnosis not present

## 2023-05-24 DIAGNOSIS — D509 Iron deficiency anemia, unspecified: Secondary | ICD-10-CM | POA: Diagnosis not present

## 2023-05-24 DIAGNOSIS — I10 Essential (primary) hypertension: Secondary | ICD-10-CM | POA: Diagnosis not present

## 2023-05-24 DIAGNOSIS — E7800 Pure hypercholesterolemia, unspecified: Secondary | ICD-10-CM | POA: Diagnosis not present

## 2023-05-26 DIAGNOSIS — D509 Iron deficiency anemia, unspecified: Secondary | ICD-10-CM | POA: Diagnosis not present

## 2023-05-26 DIAGNOSIS — D72819 Decreased white blood cell count, unspecified: Secondary | ICD-10-CM | POA: Diagnosis not present

## 2023-05-26 DIAGNOSIS — E7800 Pure hypercholesterolemia, unspecified: Secondary | ICD-10-CM | POA: Diagnosis not present

## 2023-05-26 DIAGNOSIS — J309 Allergic rhinitis, unspecified: Secondary | ICD-10-CM | POA: Diagnosis not present

## 2023-05-26 DIAGNOSIS — E039 Hypothyroidism, unspecified: Secondary | ICD-10-CM | POA: Diagnosis not present

## 2023-05-26 DIAGNOSIS — M545 Low back pain, unspecified: Secondary | ICD-10-CM | POA: Diagnosis not present

## 2023-05-26 DIAGNOSIS — I10 Essential (primary) hypertension: Secondary | ICD-10-CM | POA: Diagnosis not present

## 2023-05-26 DIAGNOSIS — L309 Dermatitis, unspecified: Secondary | ICD-10-CM | POA: Diagnosis not present

## 2023-07-07 DIAGNOSIS — E039 Hypothyroidism, unspecified: Secondary | ICD-10-CM | POA: Diagnosis not present

## 2023-07-11 ENCOUNTER — Telehealth: Payer: Self-pay | Admitting: *Deleted

## 2023-07-11 NOTE — Telephone Encounter (Signed)
 Received labwork from [place] at [facility] with low ferritin which may be impacting his thyroid  levels.  Scheduling message sent to get patient in with labs/APP appointment.

## 2023-07-20 ENCOUNTER — Inpatient Hospital Stay (HOSPITAL_BASED_OUTPATIENT_CLINIC_OR_DEPARTMENT_OTHER): Admitting: Hematology & Oncology

## 2023-07-20 ENCOUNTER — Encounter: Payer: Self-pay | Admitting: Hematology & Oncology

## 2023-07-20 ENCOUNTER — Inpatient Hospital Stay: Attending: Hematology & Oncology

## 2023-07-20 VITALS — BP 147/73 | HR 66 | Temp 98.2°F | Resp 17 | Wt 204.8 lb

## 2023-07-20 DIAGNOSIS — D72819 Decreased white blood cell count, unspecified: Secondary | ICD-10-CM | POA: Insufficient documentation

## 2023-07-20 DIAGNOSIS — Z87891 Personal history of nicotine dependence: Secondary | ICD-10-CM | POA: Insufficient documentation

## 2023-07-20 LAB — CBC WITH DIFFERENTIAL (CANCER CENTER ONLY)
Abs Immature Granulocytes: 0.01 10*3/uL (ref 0.00–0.07)
Basophils Absolute: 0 10*3/uL (ref 0.0–0.1)
Basophils Relative: 1 %
Eosinophils Absolute: 0.2 10*3/uL (ref 0.0–0.5)
Eosinophils Relative: 8 %
HCT: 35.1 % — ABNORMAL LOW (ref 39.0–52.0)
Hemoglobin: 10.8 g/dL — ABNORMAL LOW (ref 13.0–17.0)
Immature Granulocytes: 0 %
Lymphocytes Relative: 42 %
Lymphs Abs: 1.1 10*3/uL (ref 0.7–4.0)
MCH: 24.3 pg — ABNORMAL LOW (ref 26.0–34.0)
MCHC: 30.8 g/dL (ref 30.0–36.0)
MCV: 78.9 fL — ABNORMAL LOW (ref 80.0–100.0)
Monocytes Absolute: 0.4 10*3/uL (ref 0.1–1.0)
Monocytes Relative: 16 %
Neutro Abs: 0.8 10*3/uL — ABNORMAL LOW (ref 1.7–7.7)
Neutrophils Relative %: 33 %
Platelet Count: 156 10*3/uL (ref 150–400)
RBC: 4.45 MIL/uL (ref 4.22–5.81)
RDW: 15.2 % (ref 11.5–15.5)
WBC Count: 2.6 10*3/uL — ABNORMAL LOW (ref 4.0–10.5)
nRBC: 0 % (ref 0.0–0.2)

## 2023-07-20 LAB — SAVE SMEAR(SSMR), FOR PROVIDER SLIDE REVIEW

## 2023-07-20 NOTE — Progress Notes (Signed)
 Hematology and Oncology Follow Up Visit  [name] [id number] [birth year] 78 y.o. 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 [name] is back for follow-up.  We last saw him back in December.  Since then, he has been doing okay.  He really has had no specific complaints.  He has had no problems with nausea or vomiting.  He has had no bleeding or bruising.  He has had a good appetite.  He is still trying to stay active.  He has had no problems with COVID or Influenza.  He has had no change in the swelling in his right lower leg.  He actually has had a Doppler of the right leg back in 2024.  This all looked fine.  He has had no sweats.  He has had no cough.  He stopped smoking of the 45 years ago.  Overall, I would have to say that his performance status is probably ECOG 1.   Medications:  Current Outpatient Medications:    diltiazem (CARDIZEM CD) 180 MG 24 hr capsule, Take 360 mg by mouth daily., Disp: , Rfl:    EUTHYROX 125 MCG tablet, Take 125 mcg by mouth every morning. (Patient taking differently: Take 137 mcg by mouth every morning.), Disp: , Rfl:    ferrous sulfate 325 (65 FE) MG EC tablet, Take 325 mg by mouth daily., Disp: , Rfl:    hydrochlorothiazide (HYDRODIURIL) 25 MG tablet, Take 25 mg by mouth as needed., Disp: , Rfl:    lisinopril (ZESTRIL) 20 MG tablet, Take 20 mg by mouth daily., Disp: , Rfl:    pravastatin (PRAVACHOL) 40 MG tablet, Take 40 mg by mouth daily., Disp: , Rfl:   Allergies: No Known Allergies  Past Medical History, Surgical history, Social history, and Family History were reviewed and updated.  Review of Systems: Review of Systems  Constitutional:  Positive for fatigue.  HENT:  Negative.    Eyes: Negative.   Respiratory: Negative.    Cardiovascular:  Positive for leg swelling.  Gastrointestinal: Negative.   Endocrine: Negative.    Genitourinary: Negative.    Musculoskeletal: Negative.   Skin: Negative.   Neurological: Negative.   Hematological: Negative.   Psychiatric/Behavioral: Negative.      Physical Exam:  weight is 204 lb 12.8 oz (92.9 kg). His oral temperature is 98.2 F (36.8 C). His blood pressure is 147/73 (abnormal) and his pulse is 66. His respiration is 17 and oxygen saturation is 100%.   Wt Readings from Last 3 Encounters:  07/20/23 204 lb 12.8 oz (92.9 kg)  01/07/23 206 lb (93.4 kg)    Physical Exam Vitals reviewed.  HENT:     Head: Normocephalic and atraumatic.   Eyes:     Pupils: Pupils are equal, round, and reactive to light.    Cardiovascular:     Rate and Rhythm: Normal rate and regular rhythm.     Heart sounds: Normal heart sounds.  Pulmonary:     Effort: Pulmonary effort is normal.     Breath sounds: Normal breath sounds.  Abdominal:     General: Bowel sounds are normal.     Palpations: Abdomen is soft.     Comments: Abdominal exam is soft.  He has decent bowel sounds.  There is no fluid wave.  His liver edge might be at the right costal margin.  I really cannot palpate spleen.   Musculoskeletal:  General: No tenderness or deformity. Normal range of motion.     Cervical back: Normal range of motion.     Comments: He does have some 1-2+ edema in the right ankle.  Lymphadenopathy:     Cervical: No cervical adenopathy.   Skin:    General: Skin is warm and dry.     Findings: No erythema or rash.   Neurological:     Mental Status: He is alert and oriented to person, place, and time.   Psychiatric:        Behavior: Behavior normal.        Thought Content: Thought content normal.        Judgment: Judgment normal.      Lab Results  Component Value Date   WBC 2.6 (L) 07/20/2023   HGB 10.8 (L) 07/20/2023   HCT 35.1 (L) 07/20/2023   MCV 78.9 (L) 07/20/2023   PLT 156 07/20/2023     Chemistry      Component Value Date/Time   NA 141 06/23/2007 0425   K 3.8  06/23/2007 0425   CL 108 06/23/2007 0425   CO2 28 06/23/2007 0425   BUN 4 (L) 06/23/2007 0425   CREATININE 0.94 06/23/2007 0425      Component Value Date/Time   CALCIUM 9.1 06/23/2007 0425   ALKPHOS 57 06/22/2007 1035   AST 24 06/22/2007 1035   ALT 16 06/22/2007 1035   BILITOT 0.8 06/22/2007 1035     Peripheral blood smear shows mild anisocytosis and poikilocytosis.  He did have quite a few elliptocytes.  I do not see any nucleated red blood cells.  There is a couple schistocytes.  He has no target cells.  There is no clumping.  White blood cells are slightly decreased in number.  His white blood cells appear mature.  He has no hypersegmented polys.  I do not see any blasts.  Platelets are adequate in number and size.  Platelets are well granulated.   Impression and Plan:  Mr. [name] is a very nice 78 year old African-American male.  Again, I would limit troubled by the fact that his white cell count is little bit lower.  His peripheral blood smear so I did not show any much.  I still think that we can watch him.  I want to get him back in 2 months now.  I think if things look a little bit worse than 2 months, we will get him to think about a bone marrow biopsy on him.  Again, his exam was relatively unrevealing.  We will go ahead and plan to get him back in 2 months.      Ivor Mars, MD 6/18/20252:33 PM

## 2023-09-13 DIAGNOSIS — F17211 Nicotine dependence, cigarettes, in remission: Secondary | ICD-10-CM | POA: Diagnosis not present

## 2023-09-13 DIAGNOSIS — Z6827 Body mass index (BMI) 27.0-27.9, adult: Secondary | ICD-10-CM | POA: Diagnosis not present

## 2023-09-13 DIAGNOSIS — E663 Overweight: Secondary | ICD-10-CM | POA: Diagnosis not present

## 2023-09-13 DIAGNOSIS — Z008 Encounter for other general examination: Secondary | ICD-10-CM | POA: Diagnosis not present

## 2023-09-13 DIAGNOSIS — I10 Essential (primary) hypertension: Secondary | ICD-10-CM | POA: Diagnosis not present

## 2023-09-19 ENCOUNTER — Encounter: Payer: Self-pay | Admitting: Hematology & Oncology

## 2023-09-19 ENCOUNTER — Inpatient Hospital Stay: Attending: Hematology & Oncology

## 2023-09-19 ENCOUNTER — Inpatient Hospital Stay (HOSPITAL_BASED_OUTPATIENT_CLINIC_OR_DEPARTMENT_OTHER): Admitting: Hematology & Oncology

## 2023-09-19 VITALS — BP 132/78 | HR 60 | Temp 98.2°F | Resp 20 | Ht 74.0 in | Wt 203.8 lb

## 2023-09-19 DIAGNOSIS — D72819 Decreased white blood cell count, unspecified: Secondary | ICD-10-CM

## 2023-09-19 DIAGNOSIS — R718 Other abnormality of red blood cells: Secondary | ICD-10-CM | POA: Insufficient documentation

## 2023-09-19 LAB — RETICULOCYTES
Immature Retic Fract: 12.5 % (ref 2.3–15.9)
RBC.: 4.56 MIL/uL (ref 4.22–5.81)
Retic Count, Absolute: 38.8 K/uL (ref 19.0–186.0)
Retic Ct Pct: 0.9 % (ref 0.4–3.1)

## 2023-09-19 LAB — CBC WITH DIFFERENTIAL (CANCER CENTER ONLY)
Abs Immature Granulocytes: 0 K/uL (ref 0.00–0.07)
Basophils Absolute: 0 K/uL (ref 0.0–0.1)
Basophils Relative: 1 %
Eosinophils Absolute: 0.2 K/uL (ref 0.0–0.5)
Eosinophils Relative: 9 %
HCT: 36.9 % — ABNORMAL LOW (ref 39.0–52.0)
Hemoglobin: 11.3 g/dL — ABNORMAL LOW (ref 13.0–17.0)
Immature Granulocytes: 0 %
Lymphocytes Relative: 42 %
Lymphs Abs: 1.1 K/uL (ref 0.7–4.0)
MCH: 24.4 pg — ABNORMAL LOW (ref 26.0–34.0)
MCHC: 30.6 g/dL (ref 30.0–36.0)
MCV: 79.5 fL — ABNORMAL LOW (ref 80.0–100.0)
Monocytes Absolute: 0.4 K/uL (ref 0.1–1.0)
Monocytes Relative: 16 %
Neutro Abs: 0.8 K/uL — ABNORMAL LOW (ref 1.7–7.7)
Neutrophils Relative %: 32 %
Platelet Count: 163 K/uL (ref 150–400)
RBC: 4.64 MIL/uL (ref 4.22–5.81)
RDW: 14.6 % (ref 11.5–15.5)
WBC Count: 2.6 K/uL — ABNORMAL LOW (ref 4.0–10.5)
nRBC: 0 % (ref 0.0–0.2)

## 2023-09-19 LAB — CMP (CANCER CENTER ONLY)
ALT: 21 U/L (ref 0–44)
AST: 25 U/L (ref 15–41)
Albumin: 4.1 g/dL (ref 3.5–5.0)
Alkaline Phosphatase: 64 U/L (ref 38–126)
Anion gap: 7 (ref 5–15)
BUN: 11 mg/dL (ref 8–23)
CO2: 25 mmol/L (ref 22–32)
Calcium: 8.9 mg/dL (ref 8.9–10.3)
Chloride: 106 mmol/L (ref 98–111)
Creatinine: 1.02 mg/dL (ref 0.61–1.24)
GFR, Estimated: >60 mL/min (ref >60)
Glucose, Bld: 88 mg/dL (ref 70–99)
Potassium: 4.5 mmol/L (ref 3.5–5.1)
Sodium: 139 mmol/L (ref 135–145)
Total Bilirubin: 0.4 mg/dL (ref 0.0–1.2)
Total Protein: 6.8 g/dL (ref 6.5–8.1)

## 2023-09-19 LAB — IRON AND IRON BINDING CAPACITY (CC-WL,HP ONLY)
Iron: 79 ug/dL (ref 45–182)
Saturation Ratios: 23 % (ref 17.9–39.5)
TIBC: 339 ug/dL (ref 250–450)
UIBC: 260 ug/dL

## 2023-09-19 LAB — SAVE SMEAR(SSMR), FOR PROVIDER SLIDE REVIEW

## 2023-09-19 LAB — FERRITIN: Ferritin: 35 ng/mL (ref 24–336)

## 2023-09-19 NOTE — Progress Notes (Signed)
 Hematology and Oncology Follow Up Visit  [name] [id number] [birth year] 78 y.o. 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 [name] is back for follow-up.  We last saw him back in June.  Since then, he has been doing well.  He has had no problems.  There has been no issues with infections.  He has had no fever.  There has been no rash.  He has had no change in bowel or bladder habits.  He has had no cough or shortness of breath.  Thankfully, there is been no problems with COVID.  He does have a little bit of swelling in the right leg.  This has been chronic.  Currently, I would say that his performance status is probably ECOG 1.   Medications:  Current Outpatient Medications:    diltiazem (CARDIZEM CD) 180 MG 24 hr capsule, Take 360 mg by mouth daily., Disp: , Rfl:    ferrous sulfate 325 (65 FE) MG EC tablet, Take 325 mg by mouth daily., Disp: , Rfl:    hydrochlorothiazide (HYDRODIURIL) 25 MG tablet, Take 25 mg by mouth as needed. (Patient taking differently: Take 25 mg by mouth daily.), Disp: , Rfl:    levothyroxine (SYNTHROID) 137 MCG tablet, Take 137 mcg by mouth every morning., Disp: , Rfl:    lisinopril (ZESTRIL) 20 MG tablet, Take 20 mg by mouth daily., Disp: , Rfl:    loratadine (CLARITIN) 10 MG tablet, 10 mg daily as needed., Disp: , Rfl:    Multiple Vitamins-Minerals (MULTIVITAL-M PO), 1 tablet Orally once a day, Disp: , Rfl:    pravastatin (PRAVACHOL) 40 MG tablet, Take 40 mg by mouth daily., Disp: , Rfl:    acetaminophen (TYLENOL) 500 MG tablet, every 6 (six) hours as needed., Disp: , Rfl:    sildenafil (VIAGRA) 100 MG tablet, Take 100 mg by mouth as needed for erectile dysfunction., Disp: , Rfl:   Allergies: No Known Allergies  Past Medical History, Surgical history, Social history, and Family History were reviewed and updated.  Review of Systems: Review of  Systems  Constitutional:  Positive for fatigue.  HENT:  Negative.    Eyes: Negative.   Respiratory: Negative.    Cardiovascular:  Positive for leg swelling.  Gastrointestinal: Negative.   Endocrine: Negative.   Genitourinary: Negative.    Musculoskeletal: Negative.   Skin: Negative.   Neurological: Negative.   Hematological: Negative.   Psychiatric/Behavioral: Negative.      Physical Exam:  height is 6' 2 (1.88 m) and weight is 203 lb 12.8 oz (92.4 kg). His oral temperature is 98.2 F (36.8 C). His blood pressure is 132/78 and his pulse is 60. His respiration is 20.   Wt Readings from Last 3 Encounters:  09/19/23 203 lb 12.8 oz (92.4 kg)  07/20/23 204 lb 12.8 oz (92.9 kg)  01/07/23 206 lb (93.4 kg)    Physical Exam Vitals reviewed.  HENT:     Head: Normocephalic and atraumatic.  Eyes:     Pupils: Pupils are equal, round, and reactive to light.  Cardiovascular:     Rate and Rhythm: Normal rate and regular rhythm.     Heart sounds: Normal heart sounds.  Pulmonary:     Effort: Pulmonary effort is normal.     Breath sounds: Normal breath sounds.  Abdominal:     General: Bowel sounds are normal.     Palpations: Abdomen is  soft.     Comments: Abdominal exam is soft.  He has decent bowel sounds.  There is no fluid wave.  His liver edge might be at the right costal margin.  I really cannot palpate spleen.  Musculoskeletal:        General: No tenderness or deformity. Normal range of motion.     Cervical back: Normal range of motion.     Comments: He does have some 1-2+ edema in the right ankle.  Lymphadenopathy:     Cervical: No cervical adenopathy.  Skin:    General: Skin is warm and dry.     Findings: No erythema or rash.  Neurological:     Mental Status: He is alert and oriented to person, place, and time.  Psychiatric:        Behavior: Behavior normal.        Thought Content: Thought content normal.        Judgment: Judgment normal.      Lab Results   Component Value Date   WBC 2.6 (L) 09/19/2023   HGB 11.3 (L) 09/19/2023   HCT 36.9 (L) 09/19/2023   MCV 79.5 (L) 09/19/2023   PLT 163 09/19/2023     Chemistry      Component Value Date/Time   NA 141 06/23/2007 0425   K 3.8 06/23/2007 0425   CL 108 06/23/2007 0425   CO2 28 06/23/2007 0425   BUN 4 (L) 06/23/2007 0425   CREATININE 0.94 06/23/2007 0425      Component Value Date/Time   CALCIUM 9.1 06/23/2007 0425   ALKPHOS 57 06/22/2007 1035   AST 24 06/22/2007 1035   ALT 16 06/22/2007 1035   BILITOT 0.8 06/22/2007 1035     Peripheral blood smear shows mild anisocytosis and poikilocytosis.  He did have quite a few elliptocytes.  I do not see any nucleated red blood cells.  There is a couple schistocytes.  He has no target cells.  There is no clumping.  White blood cells are slightly decreased in number.  His white blood cells appear mature.  He has no hypersegmented polys.  I do not see any blasts.  Platelets are adequate in number and size.  Platelets are well granulated.   Impression and Plan:  Mr. [name] is a very nice 78 year old African-American male.  Again, I would limit troubled by the fact that his white cell count is little bit lower.  His peripheral blood smear did not show any significant abnormalities.  I think we can move his appointments out now to about 4 months.  I want to see him back around the [place].  If all looks good at that time, then we will see about getting her through the Winter.  SABRA      Maude JONELLE Crease, MD 8/18/20251:59 PM

## 2023-11-21 DIAGNOSIS — D509 Iron deficiency anemia, unspecified: Secondary | ICD-10-CM | POA: Diagnosis not present

## 2023-11-21 DIAGNOSIS — E039 Hypothyroidism, unspecified: Secondary | ICD-10-CM | POA: Diagnosis not present

## 2023-11-21 DIAGNOSIS — I10 Essential (primary) hypertension: Secondary | ICD-10-CM | POA: Diagnosis not present

## 2023-11-21 DIAGNOSIS — E7800 Pure hypercholesterolemia, unspecified: Secondary | ICD-10-CM | POA: Diagnosis not present

## 2023-11-21 LAB — LAB REPORT - SCANNED
Creatinine, POC: 152 mg/dL
EGFR: 72
Microalb Creat Ratio: 5.1
Microalbumin, Urine: 0.78
TSH: 3.84 (ref 0.41–5.90)

## 2024-01-19 ENCOUNTER — Ambulatory Visit: Admitting: Hematology & Oncology

## 2024-01-19 ENCOUNTER — Inpatient Hospital Stay

## 2024-02-16 ENCOUNTER — Encounter: Payer: Self-pay | Admitting: Hematology & Oncology

## 2024-02-16 ENCOUNTER — Other Ambulatory Visit: Payer: Self-pay

## 2024-02-16 ENCOUNTER — Inpatient Hospital Stay: Attending: Hematology & Oncology

## 2024-02-16 ENCOUNTER — Inpatient Hospital Stay: Admitting: Hematology & Oncology

## 2024-02-16 VITALS — BP 149/81 | HR 61 | Temp 97.7°F | Resp 18 | Ht 74.0 in | Wt 206.0 lb

## 2024-02-16 DIAGNOSIS — D72819 Decreased white blood cell count, unspecified: Secondary | ICD-10-CM | POA: Insufficient documentation

## 2024-02-16 DIAGNOSIS — I10 Essential (primary) hypertension: Secondary | ICD-10-CM | POA: Insufficient documentation

## 2024-02-16 DIAGNOSIS — L309 Dermatitis, unspecified: Secondary | ICD-10-CM | POA: Insufficient documentation

## 2024-02-16 DIAGNOSIS — E7800 Pure hypercholesterolemia, unspecified: Secondary | ICD-10-CM | POA: Insufficient documentation

## 2024-02-16 DIAGNOSIS — R609 Edema, unspecified: Secondary | ICD-10-CM | POA: Insufficient documentation

## 2024-02-16 DIAGNOSIS — E039 Hypothyroidism, unspecified: Secondary | ICD-10-CM | POA: Insufficient documentation

## 2024-02-16 DIAGNOSIS — D708 Other neutropenia: Secondary | ICD-10-CM | POA: Diagnosis not present

## 2024-02-16 DIAGNOSIS — J309 Allergic rhinitis, unspecified: Secondary | ICD-10-CM | POA: Insufficient documentation

## 2024-02-16 DIAGNOSIS — N529 Male erectile dysfunction, unspecified: Secondary | ICD-10-CM | POA: Insufficient documentation

## 2024-02-16 DIAGNOSIS — L290 Pruritus ani: Secondary | ICD-10-CM | POA: Insufficient documentation

## 2024-02-16 DIAGNOSIS — M545 Low back pain, unspecified: Secondary | ICD-10-CM | POA: Insufficient documentation

## 2024-02-16 DIAGNOSIS — D509 Iron deficiency anemia, unspecified: Secondary | ICD-10-CM | POA: Insufficient documentation

## 2024-02-16 LAB — CMP (CANCER CENTER ONLY)
ALT: 15 U/L (ref 0–44)
AST: 22 U/L (ref 15–41)
Albumin: 4.1 g/dL (ref 3.5–5.0)
Alkaline Phosphatase: 76 U/L (ref 38–126)
Anion gap: 7 (ref 5–15)
BUN: 11 mg/dL (ref 8–23)
CO2: 28 mmol/L (ref 22–32)
Calcium: 9 mg/dL (ref 8.9–10.3)
Chloride: 104 mmol/L (ref 98–111)
Creatinine: 1.09 mg/dL (ref 0.61–1.24)
GFR, Estimated: >60 mL/min
Glucose, Bld: 91 mg/dL (ref 70–99)
Potassium: 4.4 mmol/L (ref 3.5–5.1)
Sodium: 139 mmol/L (ref 135–145)
Total Bilirubin: 0.5 mg/dL (ref 0.0–1.2)
Total Protein: 7 g/dL (ref 6.5–8.1)

## 2024-02-16 LAB — CBC WITH DIFFERENTIAL (CANCER CENTER ONLY)
Abs Immature Granulocytes: 0.01 K/uL (ref 0.00–0.07)
Basophils Absolute: 0 K/uL (ref 0.0–0.1)
Basophils Relative: 1 %
Eosinophils Absolute: 0.2 K/uL (ref 0.0–0.5)
Eosinophils Relative: 7 %
HCT: 42.8 % (ref 39.0–52.0)
Hemoglobin: 13.2 g/dL (ref 13.0–17.0)
Immature Granulocytes: 0 %
Lymphocytes Relative: 46 %
Lymphs Abs: 1.4 K/uL (ref 0.7–4.0)
MCH: 24.1 pg — ABNORMAL LOW (ref 26.0–34.0)
MCHC: 30.8 g/dL (ref 30.0–36.0)
MCV: 78.1 fL — ABNORMAL LOW (ref 80.0–100.0)
Monocytes Absolute: 0.6 K/uL (ref 0.1–1.0)
Monocytes Relative: 19 %
Neutro Abs: 0.8 K/uL — ABNORMAL LOW (ref 1.7–7.7)
Neutrophils Relative %: 27 %
Platelet Count: 147 K/uL — ABNORMAL LOW (ref 150–400)
RBC: 5.48 MIL/uL (ref 4.22–5.81)
RDW: 14.2 % (ref 11.5–15.5)
WBC Count: 3 K/uL — ABNORMAL LOW (ref 4.0–10.5)
nRBC: 0 % (ref 0.0–0.2)

## 2024-02-16 LAB — LACTATE DEHYDROGENASE: LDH: 176 U/L (ref 105–235)

## 2024-02-16 LAB — SAVE SMEAR(SSMR), FOR PROVIDER SLIDE REVIEW

## 2024-02-16 NOTE — Progress Notes (Signed)
 " Hematology and Oncology Follow Up Visit  [name] [id number] 1945/10/04 79 y.o. 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  We last saw him back in August.  Since then, he has been doing well.  He had no problems over the Holiday season.  He has been eating well.  He is no rashes.  He has had no swollen lymph nodes.  He has had no weight loss or weight gain.  He has had no change in bowel or bladder habits.  There is been no bleeding.  He has had no headache.  He has had no dysphagia or odynophagia.  Overall, I would have to say that his performance status is probably ECOG 1.    Medications:  Current Outpatient Medications:    acetaminophen (TYLENOL) 500 MG tablet, every 6 (six) hours as needed., Disp: , Rfl:    diltiazem (CARDIZEM CD) 180 MG 24 hr capsule, Take 360 mg by mouth daily., Disp: , Rfl:    ferrous sulfate 325 (65 FE) MG EC tablet, Take 325 mg by mouth daily., Disp: , Rfl:    hydrochlorothiazide (HYDRODIURIL) 25 MG tablet, Take 25 mg by mouth as needed. (Patient taking differently: Take 25 mg by mouth daily.), Disp: , Rfl:    levothyroxine (SYNTHROID) 137 MCG tablet, Take 137 mcg by mouth every morning., Disp: , Rfl:    lisinopril (ZESTRIL) 20 MG tablet, Take 20 mg by mouth daily., Disp: , Rfl:    loratadine (CLARITIN) 10 MG tablet, 10 mg daily as needed., Disp: , Rfl:    Multiple Vitamins-Minerals (MULTIVITAL-M PO), 1 tablet Orally once a day, Disp: , Rfl:    pravastatin (PRAVACHOL) 40 MG tablet, Take 40 mg by mouth daily., Disp: , Rfl:    sildenafil (VIAGRA) 100 MG tablet, Take 100 mg by mouth as needed for erectile dysfunction., Disp: , Rfl:   Allergies:  Allergies  Allergen Reactions   Lisinopril-Hydrochlorothiazide     Other Reaction(s): feet tingling   Simvastatin Other (See Comments)    Past Medical History, Surgical  history, Social history, and Family History were reviewed and updated.  Review of Systems: Review of Systems  Constitutional:  Positive for fatigue.  HENT:  Negative.    Eyes: Negative.   Respiratory: Negative.    Cardiovascular:  Positive for leg swelling.  Gastrointestinal: Negative.   Endocrine: Negative.   Genitourinary: Negative.    Musculoskeletal: Negative.   Skin: Negative.   Neurological: Negative.   Hematological: Negative.   Psychiatric/Behavioral: Negative.      Physical Exam:  height is 6' 2 (1.88 m) and weight is 206 lb (93.4 kg). His oral temperature is 97.7 F (36.5 C). His blood pressure is 149/81 (abnormal) and his pulse is 61. His respiration is 18 and oxygen saturation is 100%.   Wt Readings from Last 3 Encounters:  02/16/24 206 lb (93.4 kg)  09/19/23 203 lb 12.8 oz (92.4 kg)  07/20/23 204 lb 12.8 oz (92.9 kg)    Physical Exam Vitals reviewed.  HENT:     Head: Normocephalic and atraumatic.  Eyes:     Pupils: Pupils are equal, round, and reactive to light.  Cardiovascular:     Rate and Rhythm: Normal rate and regular rhythm.     Heart sounds: Normal heart sounds.  Pulmonary:     Effort: Pulmonary effort is normal.  Breath sounds: Normal breath sounds.  Abdominal:     General: Bowel sounds are normal.     Palpations: Abdomen is soft.     Comments: Abdominal exam is soft.  He has decent bowel sounds.  There is no fluid wave.  His liver edge might be at the right costal margin.  I really cannot palpate spleen.  Musculoskeletal:        General: No tenderness or deformity. Normal range of motion.     Cervical back: Normal range of motion.     Comments: He does have some 1-2+ edema in the right ankle.  Lymphadenopathy:     Cervical: No cervical adenopathy.  Skin:    General: Skin is warm and dry.     Findings: No erythema or rash.  Neurological:     Mental Status: He is alert and oriented to person, place, and time.  Psychiatric:         Behavior: Behavior normal.        Thought Content: Thought content normal.        Judgment: Judgment normal.      Lab Results  Component Value Date   WBC 3.0 (L) 02/16/2024   HGB 13.2 02/16/2024   HCT 42.8 02/16/2024   MCV 78.1 (L) 02/16/2024   PLT 147 (L) 02/16/2024     Chemistry      Component Value Date/Time   NA 139 02/16/2024 1443   K 4.4 02/16/2024 1443   CL 104 02/16/2024 1443   CO2 28 02/16/2024 1443   BUN 11 02/16/2024 1443   CREATININE 1.09 02/16/2024 1443      Component Value Date/Time   CALCIUM 9.0 02/16/2024 1443   ALKPHOS 76 02/16/2024 1443   AST 22 02/16/2024 1443   ALT 15 02/16/2024 1443   BILITOT 0.5 02/16/2024 1443     Peripheral blood smear shows mild anisocytosis and poikilocytosis.  He did have quite a few elliptocytes.  I do not see any nucleated red blood cells.  There is a couple schistocytes.  He has no target cells.  There is no clumping.  White blood cells are slightly decreased in number.  His white blood cells appear mature.  He has no hypersegmented polys.  I do not see any blasts.  Platelets are adequate in number and size.  Platelets are well granulated.   Impression and Plan:  [name] is a very nice 79 year old African-American male.  So far, everything is going pretty stable.  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  He really is asymptomatic with this.  For right now, we will plan to get him back in about 5 months.  This will put his close to Summertime.  As always, we have excellent fellowship.  SABRA      Maude JONELLE Crease, MD 1/15/20263:22 PM "

## 2024-02-22 NOTE — Progress Notes (Signed)
 [name]                                          MRN: [id number]   02/22/2024   The [facility] Quality Team Specialist reviewed this patient medical record for the purposes of chart review for care gap closure. The following were reviewed: chart review for care gap closure-controlling blood pressure.    [facility] Quality Team

## 2024-07-12 ENCOUNTER — Inpatient Hospital Stay

## 2024-07-12 ENCOUNTER — Inpatient Hospital Stay: Admitting: Hematology & Oncology
